# Patient Record
Sex: Female | Born: 1980
Health system: Southern US, Community
[De-identification: ages and names within clinical notes are randomized; demographics above are authoritative.]

## PROBLEM LIST (undated history)

## (undated) ENCOUNTER — Inpatient Hospital Stay (HOSPITAL_COMMUNITY): Payer: Self-pay

## (undated) DIAGNOSIS — R87629 Unspecified abnormal cytological findings in specimens from vagina: Secondary | ICD-10-CM

## (undated) DIAGNOSIS — A749 Chlamydial infection, unspecified: Secondary | ICD-10-CM

## (undated) DIAGNOSIS — A4902 Methicillin resistant Staphylococcus aureus infection, unspecified site: Secondary | ICD-10-CM

## (undated) DIAGNOSIS — O409XX0 Polyhydramnios, unspecified trimester, not applicable or unspecified: Secondary | ICD-10-CM

## (undated) DIAGNOSIS — T7840XA Allergy, unspecified, initial encounter: Secondary | ICD-10-CM

## (undated) DIAGNOSIS — F329 Major depressive disorder, single episode, unspecified: Secondary | ICD-10-CM

## (undated) DIAGNOSIS — A599 Trichomoniasis, unspecified: Secondary | ICD-10-CM

## (undated) DIAGNOSIS — B999 Unspecified infectious disease: Secondary | ICD-10-CM

## (undated) DIAGNOSIS — F32A Depression, unspecified: Secondary | ICD-10-CM

## (undated) DIAGNOSIS — O009 Unspecified ectopic pregnancy without intrauterine pregnancy: Secondary | ICD-10-CM

## (undated) DIAGNOSIS — A549 Gonococcal infection, unspecified: Secondary | ICD-10-CM

## (undated) HISTORY — PX: CERVICAL CONE BIOPSY: SUR198

## (undated) HISTORY — DX: Allergy, unspecified, initial encounter: T78.40XA

---
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
## 2003-03-18 ENCOUNTER — Emergency Department (HOSPITAL_COMMUNITY): Admission: AD | Admit: 2003-03-18 | Discharge: 2003-03-18 | Payer: Self-pay | Admitting: Family Medicine

## 2006-04-13 DIAGNOSIS — A4902 Methicillin resistant Staphylococcus aureus infection, unspecified site: Secondary | ICD-10-CM

## 2006-04-13 HISTORY — DX: Methicillin resistant Staphylococcus aureus infection, unspecified site: A49.02

## 2006-07-05 ENCOUNTER — Emergency Department (HOSPITAL_COMMUNITY): Admission: EM | Admit: 2006-07-05 | Discharge: 2006-07-05 | Payer: Self-pay | Admitting: Family Medicine

## 2006-10-05 ENCOUNTER — Emergency Department (HOSPITAL_COMMUNITY): Admission: EM | Admit: 2006-10-05 | Discharge: 2006-10-05 | Payer: Self-pay | Admitting: Emergency Medicine

## 2007-02-14 ENCOUNTER — Emergency Department (HOSPITAL_COMMUNITY): Admission: EM | Admit: 2007-02-14 | Discharge: 2007-02-14 | Payer: Self-pay | Admitting: Emergency Medicine

## 2007-03-21 ENCOUNTER — Ambulatory Visit: Payer: Self-pay | Admitting: Family Medicine

## 2008-11-08 ENCOUNTER — Emergency Department (HOSPITAL_COMMUNITY): Admission: EM | Admit: 2008-11-08 | Discharge: 2008-11-08 | Payer: Self-pay | Admitting: Family Medicine

## 2010-07-20 LAB — POCT RAPID STREP A (OFFICE): Streptococcus, Group A Screen (Direct): NEGATIVE

## 2010-07-20 LAB — POCT INFECTIOUS MONO SCREEN: Mono Screen: POSITIVE — AB

## 2011-04-08 ENCOUNTER — Emergency Department (HOSPITAL_COMMUNITY)
Admission: EM | Admit: 2011-04-08 | Discharge: 2011-04-08 | Disposition: A | Discharge status: Home / Self Care | Payer: Self-pay | Source: Home / Self Care | Attending: Family Medicine | Admitting: Family Medicine

## 2011-04-08 DIAGNOSIS — R3911 Hesitancy of micturition: Secondary | ICD-10-CM

## 2011-04-08 LAB — POCT URINALYSIS DIP (DEVICE)
Bilirubin Urine: NEGATIVE
Glucose, UA: NEGATIVE mg/dL
Ketones, ur: 15 mg/dL — AB
Leukocytes, UA: NEGATIVE
pH: 5.5 (ref 5.0–8.0)

## 2011-04-08 NOTE — ED Notes (Addendum)
Pt states that on 12-21, she had a random UDS on her job, and was unable to void in sufficient amt to provide adequate  sample after 3 hours in spite of forcing fluids , and was reportedly inst to come to the [facility] or her regular MD  Within 5 days to determine cause of her "shy bladder" problem to avoid issues with continued employment

## 2011-04-08 NOTE — ED Provider Notes (Signed)
History     CSN: [id number]  Arrival date & time 04/08/11  1751   First MD Initiated Contact with Patient 04/08/11 1803      Chief Complaint  Patient presents with  . Urinary Retention    (Consider location/radiation/quality/duration/timing/severity/associated sxs/prior treatment) HPI Comments: Linda Moyer presents for evaluation of "shy bladder." She reports having difficulty producing urine on a random urine drug screen on 04/03/11, for her employer. 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 She adamantly denies any illegal drug use, no alcohol. She states that she was referred for evaluation of "shy bladder." She denies any dysuria, frequency, urgency, pain.  Patient is a 30 y.o. female presenting with female genitourinary complaint. The history is provided by the patient.  Female GU Problem Primary symptoms include no discharge, no genital itching, no genital odor and no dysuria. There has been no fever. Pertinent negatives include no frequency. Sexual activity: non-contributory.    History reviewed. No pertinent past medical history.  History reviewed. No pertinent past surgical history.  History reviewed. No pertinent family history.  History  Substance Use Topics  . Smoking status: Never Smoker   . Smokeless tobacco: Not on file  . Alcohol Use: Yes     occasional     OB History    Grav Para Term Preterm Abortions TAB SAB Ect Mult Living                  Review of Systems  Constitutional: Negative.   HENT: Negative.   Eyes: Negative.   Respiratory: Negative.   Cardiovascular: Negative.   Gastrointestinal: Negative.   Genitourinary: Positive for decreased urine volume and difficulty urinating. Negative for dysuria, urgency, frequency and enuresis.  Musculoskeletal: Negative.   Skin: Negative.   Neurological: Negative.     Allergies  Review of patient's allergies indicates no known allergies.  Home Medications  No current outpatient prescriptions  on file.  BP 119/81  Pulse 70  Temp(Src) 98 F (36.7 C) (Oral)  Resp 14  SpO2 100%  Physical Exam  Nursing note and vitals reviewed. Constitutional: She is oriented to person, place, and time. She appears well-developed and well-nourished.  HENT:  Head: Normocephalic and atraumatic.  Eyes: EOM are normal.  Neck: Normal range of motion.  Pulmonary/Chest: Effort normal.  Musculoskeletal: Normal range of motion.  Neurological: She is alert and oriented to person, place, and time.  Skin: Skin is warm and dry.  Psychiatric: Her behavior is normal.    ED Course  Procedures (including critical care time)  Labs Reviewed  POCT URINALYSIS DIP (DEVICE) - Abnormal; Notable for the following:    Ketones, ur 15 (*)    All other components within normal limits  POCT URINALYSIS DIPSTICK   No results found.   1. Urinary hesitancy       MDM  Labs reviewed        Richardo Priest, MD 04/08/11 2038

## 2012-05-20 ENCOUNTER — Ambulatory Visit: Payer: Self-pay | Admitting: Family Medicine

## 2012-05-20 VITALS — BP 107/72 | HR 75 | Temp 98.6°F | Resp 16 | Ht 66.5 in | Wt 123.8 lb

## 2012-05-20 DIAGNOSIS — Z0289 Encounter for other administrative examinations: Secondary | ICD-10-CM

## 2012-05-20 NOTE — Progress Notes (Signed)
  Subjective:    Patient ID: Linda Moyer, female    DOB: June 06, 1980, 32 y.o.   MRN: [id number]  HPI Linda Moyer is a 32 y.o. female Here for DOT physical.   Last DOT about 2 years ago.  - 07/09/10. 2 year card. No restrictions.  No medical problems, no meds, no surgeries.    Review of Systems  Eyes: Negative for visual disturbance.  Musculoskeletal: Negative for arthralgias.  Neurological: Negative for weakness.       Objective:   Physical Exam  Vitals reviewed. Constitutional: She is oriented to person, place, and time. She appears well-developed and well-nourished.  HENT:  Head: Normocephalic and atraumatic.  Right Ear: External ear normal.  Left Ear: External ear normal.  Mouth/Throat: Oropharynx is clear and moist.  Eyes: Conjunctivae normal are normal. Pupils are equal, round, and reactive to light.  Neck: Normal range of motion. Neck supple. No thyromegaly present.  Cardiovascular: Normal rate, regular rhythm, normal heart sounds and intact distal pulses.   No murmur heard. Pulmonary/Chest: Effort normal and breath sounds normal. No respiratory distress. She has no wheezes.  Abdominal: Soft. Bowel sounds are normal. There is no tenderness. No hernia.  Musculoskeletal: Normal range of motion. She exhibits no edema and no tenderness.  Lymphadenopathy:    She has no cervical adenopathy.  Neurological: She is alert and oriented to person, place, and time.  Skin: Skin is warm and dry. No rash noted.  Psychiatric: She has a normal mood and affect. Her behavior is normal. Thought content normal.       Assessment & Plan:  CHEYENNA Moyer is a 32 y.o. female 1. Health examination of defined subpopulation    DPT physical.  No concerning findings on exam or hx. 2 year card.

## 2012-05-24 ENCOUNTER — Encounter: Payer: Self-pay | Admitting: Family Medicine

## 2012-08-09 ENCOUNTER — Ambulatory Visit: Payer: Self-pay | Admitting: Emergency Medicine

## 2012-08-09 VITALS — BP 119/83 | HR 71 | Temp 98.6°F | Resp 16 | Ht 66.5 in | Wt 126.0 lb

## 2012-08-09 DIAGNOSIS — L259 Unspecified contact dermatitis, unspecified cause: Secondary | ICD-10-CM

## 2012-08-09 MED ORDER — TRIAMCINOLONE ACETONIDE 0.1 % EX CREA: TOPICAL_CREAM | Freq: Two times a day (BID) | CUTANEOUS | Status: DC

## 2012-08-09 NOTE — Progress Notes (Signed)
[facility] [address], [place] [address] [phone]  Date:  08/09/2012   Name:  Linda Moyer   DOB:  06-17-1980   MRN:  [id number]  PCP:  No PCP Per Patient    Chief Complaint: Rash   History of Present Illness:  Linda Moyer is a 32 y.o. very pleasant female patient who presents with the following:  No history of allergen exposure, new personal care product or medication or soap.  Has a rash on the right and left flank.  Pruritic.  No fever or chills.  Present for past few days.  No improvement with over the counter medications or other home remedies. Denies other complaint or health concern today.   There are no active problems to display for this patient.   Past Medical History  Diagnosis Date  . Allergy     History reviewed. No pertinent past surgical history.  History  Substance Use Topics  . Smoking status: Never Smoker   . Smokeless tobacco: Not on file  . Alcohol Use: Yes     Comment: occasional     History reviewed. No pertinent family history.  No Known Allergies  Medication list has been reviewed and updated.  Current Outpatient Prescriptions on File Prior to Visit  Medication Sig Dispense Refill  . Norgestimate-Ethinyl Estradiol Triphasic (ORTHO TRI-CYCLEN LO) 0.18/0.215/0.25 MG-25 MCG tab Take 1 tablet by mouth daily. Patient takes regular orthotri-cyclen. Switching back to LO.       No current facility-administered medications on file prior to visit.    Review of Systems:  As per HPI, otherwise negative.    Physical Examination: Filed Vitals:   08/09/12 2009  BP: 119/83  Pulse: 71  Temp: 98.6 F (37 C)  Resp: 16   Filed Vitals:   08/09/12 2009  Height: 5' 6.5" (1.689 m)  Weight: 126 lb (57.153 kg)   Body mass index is 20.03 kg/(m^2). Ideal Body Weight: Weight in (lb) to have BMI = 25: 156.9   GEN: WDWN, NAD, Non-toxic, Alert & Oriented x 3 HEENT: Atraumatic, Normocephalic.  Ears and Nose: No  external deformity. EXTR: No clubbing/cyanosis/edema NEURO: Normal gait.  PSYCH: Normally interactive. Conversant. Not depressed or anxious appearing.  Calm demeanor.  SKIN:  Rash on both right and left flank.  Erythematous.  No vesicles.  No cellulitis   Assessment and Plan: Allergic dermatitis TAC Benadryl while not working   Signed,  Phillips Odor, MD

## 2012-08-09 NOTE — Patient Instructions (Addendum)
Rash  A rash is a change in the color or texture of your skin. There are many different types of rashes. You may have other problems that accompany your rash.  CAUSES     Infections.   Allergic reactions. This can include allergies to pets or foods.   Certain medicines.   Exposure to certain chemicals, soaps, or cosmetics.   Heat.   Exposure to poisonous plants.   Tumors, both cancerous and noncancerous.  SYMPTOMS     Redness.   Scaly skin.   Itchy skin.   Dry or cracked skin.   Bumps.   Blisters.   Pain.  DIAGNOSIS   Your caregiver may do a physical exam to determine what type of rash you have. A skin sample (biopsy) may be taken and examined under a microscope.  TREATMENT    Treatment depends on the type of rash you have. Your caregiver may prescribe certain medicines. For serious conditions, you may need to see a skin doctor (dermatologist).  HOME CARE INSTRUCTIONS     Avoid the substance that caused your rash.   Do not scratch your rash. This can cause infection.   You may take cool baths to help stop itching.   Only take over-the-counter or prescription medicines as directed by your caregiver.   Keep all follow-up appointments as directed by your caregiver.  SEEK IMMEDIATE MEDICAL CARE IF:   You have increasing pain, swelling, or redness.   You have a fever.   You have new or severe symptoms.   You have body aches, diarrhea, or vomiting.   Your rash is not better after 3 days.  MAKE SURE YOU:   Understand these instructions.   Will watch your condition.   Will get help right away if you are not doing well or get worse.  Document Released: 03/20/2002 Document Revised: 06/22/2011 Document Reviewed: 01/12/2011  ExitCare Patient Information 2013 [employer].

## 2012-12-06 ENCOUNTER — Emergency Department (INDEPENDENT_AMBULATORY_CARE_PROVIDER_SITE_OTHER)
Admission: EM | Admit: 2012-12-06 | Discharge: 2012-12-06 | Disposition: A | Discharge status: Home / Self Care | Payer: 59 | Source: Home / Self Care

## 2012-12-06 ENCOUNTER — Encounter (HOSPITAL_COMMUNITY): Payer: Self-pay | Admitting: *Deleted

## 2012-12-06 DIAGNOSIS — L03119 Cellulitis of unspecified part of limb: Secondary | ICD-10-CM

## 2012-12-06 DIAGNOSIS — IMO0002 Reserved for concepts with insufficient information to code with codable children: Secondary | ICD-10-CM

## 2012-12-06 MED ORDER — FLUCONAZOLE 150 MG PO TABS: 150.0000 mg | ORAL_TABLET | Freq: Once | ORAL | Status: DC

## 2012-12-06 MED ORDER — DOXYCYCLINE HYCLATE 100 MG PO CAPS: 100.0000 mg | ORAL_CAPSULE | Freq: Two times a day (BID) | ORAL | Status: DC

## 2012-12-06 NOTE — ED Notes (Signed)
Pt  Noticed  A  Red   Swollen      Itching  Area  To  r  Elbow      For  sev  Days      Pt    Relates  It to a  Possible  Insect  Bite      She  Displays  No       Angioedema  And  Is  sittting   Upright on the  Exam table      Speaking in  Complete  sentances

## 2012-12-06 NOTE — ED Provider Notes (Signed)
Linda Moyer is a 32 y.o. female who presents to Urgent Care today for right medial elbow pain. Patient noted pain and erythema in her right medial elbow starting yesterday. It has worsened. She denies any fevers or chills nausea vomiting or diarrhea. She does not use any medications yet. She denies any injury. She thinks possibly she had a bug bite.   PMH reviewed. Healthy otherwise History  Substance Use Topics  . Smoking status: Never Smoker   . Smokeless tobacco: Not on file  . Alcohol Use: Yes     Comment: occasional    ROS as above Medications reviewed. No current facility-administered medications for this encounter.   Current Outpatient Prescriptions  Medication Sig Dispense Refill  . doxycycline (VIBRAMYCIN) 100 MG capsule Take 1 capsule (100 mg total) by mouth 2 (two) times daily.  20 capsule  0  . Norgestimate-Ethinyl Estradiol Triphasic (ORTHO TRI-CYCLEN LO) 0.18/0.215/0.25 MG-25 MCG tab Take 1 tablet by mouth daily. Patient takes regular orthotri-cyclen. Switching back to LO.        Exam:  BP 113/72  Pulse 78  Temp(Src) 98.6 F (37 C) (Oral)  Resp 20  SpO2 100%  LMP 12/06/2012 Gen: Well NAD HEENT: EOMI,  MMM Lungs: CTABL Nl WOB Heart: RRR no MRG Abd: NABS, NT, ND Exts: Non edematous BL  LE, warm and well perfused.  Skin: Right medial elbow 5 cm area of erythema and mild tenderness. No induration or fluctuance.   No results found for this or any previous visit (from the past 24 hour(s)). No results found.  Assessment and Plan: 32 y.o. female with cellulitis of the right medial elbow. Plan to treat with doxycycline Followup is worsening Discussed warning signs or symptoms. Please see discharge instructions. Patient expresses understanding.      Rodolph Bong, MD 12/06/12 2023

## 2013-02-16 ENCOUNTER — Encounter (HOSPITAL_COMMUNITY): Payer: Self-pay | Admitting: Emergency Medicine

## 2013-02-16 ENCOUNTER — Emergency Department (HOSPITAL_COMMUNITY)
Admission: EM | Admit: 2013-02-16 | Discharge: 2013-02-16 | Disposition: A | Discharge status: Home / Self Care | Payer: 59 | Source: Home / Self Care | Attending: Emergency Medicine | Admitting: Emergency Medicine

## 2013-02-16 ENCOUNTER — Other Ambulatory Visit (HOSPITAL_COMMUNITY)
Admission: RE | Admit: 2013-02-16 | Discharge: 2013-02-16 | Disposition: A | Discharge status: Home / Self Care | Payer: 59 | Source: Ambulatory Visit | Attending: Emergency Medicine | Admitting: Emergency Medicine

## 2013-02-16 DIAGNOSIS — Z113 Encounter for screening for infections with a predominantly sexual mode of transmission: Secondary | ICD-10-CM | POA: Insufficient documentation

## 2013-02-16 DIAGNOSIS — N760 Acute vaginitis: Secondary | ICD-10-CM | POA: Insufficient documentation

## 2013-02-16 DIAGNOSIS — Z202 Contact with and (suspected) exposure to infections with a predominantly sexual mode of transmission: Secondary | ICD-10-CM

## 2013-02-16 MED ORDER — FLUCONAZOLE 150 MG PO TABS: 150.0000 mg | ORAL_TABLET | Freq: Once | ORAL | Status: DC

## 2013-02-16 NOTE — ED Provider Notes (Signed)
Medical screening examination/treatment/procedure(s) were performed by a resident physician and as supervising physician I was immediately available for consultation/collaboration.  Leslee Home, M.D.  Reuben Likes, MD 02/16/13 2117

## 2013-02-16 NOTE — ED Provider Notes (Addendum)
CSN: [id number]     Arrival date & time 02/16/13  1811 History   First MD Initiated Contact with Patient 02/16/13 2022     Chief Complaint  Patient presents with  . Vaginitis   (Consider location/radiation/quality/duration/timing/severity/associated sxs/prior Treatment) HPI  VAGINAL DISCHARGE  Onset: 9 days ago Description: white Odor: none  Itching: yes  Symptoms Dysuria: no  Bleeding: no  Pelvic pain: no  Back pain: no  Fever: no  Genital sores: no  Rash: no  Dyspareunia: no  GI Sxs: no  Prior treatment: yes, tried OTC yeast treatment   Red Flags: Missed period: no  Pregnancy: no  Recent antibiotics: no  Sexual activity: yes, unprotected sex with new partner 4 weeks ago  Possible STD exposure: yes  IUD: no  Diabetes: no     Past Medical History  Diagnosis Date  . Allergy    History reviewed. No pertinent past surgical history. History reviewed. No pertinent family history. History  Substance Use Topics  . Smoking status: Never Smoker   . Smokeless tobacco: Not on file  . Alcohol Use: Yes     Comment: occasional    OB History   Grav Para Term Preterm Abortions TAB SAB Ect Mult Living                 Review of Systems  Allergies  Review of patient's allergies indicates no known allergies.  Home Medications   Current Outpatient Rx  Name  Route  Sig  Dispense  Refill  . Norgestimate-Ethinyl Estradiol Triphasic (ORTHO TRI-CYCLEN LO) 0.18/0.215/0.25 MG-25 MCG tab   Oral   Take 1 tablet by mouth daily. Patient takes regular orthotri-cyclen. Switching back to LO.         Marland Kitchen doxycycline (VIBRAMYCIN) 100 MG capsule   Oral   Take 1 capsule (100 mg total) by mouth 2 (two) times daily.   20 capsule   0   . fluconazole (DIFLUCAN) 150 MG tablet   Oral   Take 1 tablet (150 mg total) by mouth once.   2 tablet   0    BP 122/72  Pulse 65  Temp(Src) 98.6 F (37 C) (Oral)  Resp 16  SpO2 100%  LMP 01/25/2013 Physical Exam  Constitutional: She  appears well-developed and well-nourished. No distress.  Abdominal: Soft. Bowel sounds are normal. She exhibits no distension. There is no tenderness.  Genitourinary: Uterus normal. There is no tenderness or lesion on the right labia. There is no tenderness or lesion on the left labia. Cervix exhibits discharge. Cervix exhibits no motion tenderness and no friability. Right adnexum displays no tenderness. Left adnexum displays no tenderness. No tenderness or bleeding around the vagina. Vaginal discharge found.  Thin white cervical discharge  Skin: She is not diaphoretic.    ED Course  Procedures (including critical care time) Labs Review Labs Reviewed  CERVICOVAGINAL ANCILLARY ONLY   Imaging Review No results found.    MDM   1. Vaginitis   2. Possible exposure to STD    Will treat presumptively for candidal vaginitis. Will test for diarrhea, chlamydia, Trichomonas and bacterial vaginosis. Patient climbed an HIV test.   Garnetta Buddy, MD 02/16/13 7253  Garnetta Buddy, MD 02/16/13 2203

## 2013-02-16 NOTE — ED Provider Notes (Signed)
Medical screening examination/treatment/procedure(s) were performed by a resident physician and as supervising physician I was immediately available for consultation/collaboration.  Leslee Home, M.D.  Reuben Likes, MD 02/16/13 [phone]

## 2013-02-16 NOTE — ED Notes (Signed)
C/o yeast infection for a week now OTC monistat 3 was used and a cream was used but no relief.

## 2013-02-17 ENCOUNTER — Telehealth: Payer: Self-pay | Admitting: Family Medicine

## 2013-02-17 DIAGNOSIS — A5909 Other urogenital trichomoniasis: Secondary | ICD-10-CM

## 2013-02-17 MED ORDER — METRONIDAZOLE 500 MG PO TABS: 500.0000 mg | ORAL_TABLET | Freq: Two times a day (BID) | ORAL | Status: DC

## 2013-02-17 NOTE — Telephone Encounter (Signed)
Patient called and made aware of trich and gardnerella diagnosis and sent prescription for flagyl to pharmacy. Patient's questions answered.

## 2013-02-21 ENCOUNTER — Telehealth (HOSPITAL_COMMUNITY): Payer: Self-pay | Admitting: *Deleted

## 2013-02-21 NOTE — ED Notes (Signed)
GC/Chlamydia neg., Affirm: Candida neg., Gardnerella and Trich pos. Pt. adequately treated with Flagyl for both.  I called pt. Pt. verified x 2 and given results.  Pt. states the doctor called her the results on Friday night.  Pt. instructed to notify her partner to be treated with Flagyl, no sex until you have finished your medication and your partner has been treated and to practice safe sex. Pt. voiced understanding. Vassie Moselle 02/21/2013

## 2013-04-03 NOTE — ED Notes (Signed)
Pt  Phoned  Requesting   A  Refill of  Her  Flagyl   She  Reports  Symptoms  Returned        Pt  Advised  That it had  Been  Well over  1  Month  Since  She  Was  Examined  And  That  Would  Need  To  Be  Seen again    And  We would  Be  Glad  To  See  Her       Pt thanked  Clinical research associate

## 2014-03-17 ENCOUNTER — Encounter (HOSPITAL_COMMUNITY): Payer: Self-pay | Admitting: *Deleted

## 2014-03-17 ENCOUNTER — Inpatient Hospital Stay (HOSPITAL_COMMUNITY)
Admission: AD | Admit: 2014-03-17 | Discharge: 2014-03-17 | Disposition: A | Discharge status: Home / Self Care | Payer: 59 | Source: Ambulatory Visit | Attending: Family Medicine | Admitting: Family Medicine

## 2014-03-17 DIAGNOSIS — N390 Urinary tract infection, site not specified: Secondary | ICD-10-CM | POA: Insufficient documentation

## 2014-03-17 DIAGNOSIS — N3001 Acute cystitis with hematuria: Secondary | ICD-10-CM

## 2014-03-17 DIAGNOSIS — Z202 Contact with and (suspected) exposure to infections with a predominantly sexual mode of transmission: Secondary | ICD-10-CM | POA: Insufficient documentation

## 2014-03-17 LAB — URINALYSIS, ROUTINE W REFLEX MICROSCOPIC
GLUCOSE, UA: NEGATIVE mg/dL
KETONES UR: NEGATIVE mg/dL
Nitrite: POSITIVE — AB
PROTEIN: 100 mg/dL — AB
Specific Gravity, Urine: >1.03 — ABNORMAL HIGH (ref 1.005–1.030)
UROBILINOGEN UA: 1 mg/dL (ref 0.0–1.0)
pH: 5.5 (ref 5.0–8.0)

## 2014-03-17 LAB — URINE MICROSCOPIC-ADD ON

## 2014-03-17 LAB — CBC
HEMATOCRIT: 37.9 % (ref 36.0–46.0)
Hemoglobin: 12.7 g/dL (ref 12.0–15.0)
MCH: 30.7 pg (ref 26.0–34.0)
MCHC: 33.5 g/dL (ref 30.0–36.0)
MCV: 91.5 fL (ref 78.0–100.0)
PLATELETS: 276 10*3/uL (ref 150–400)
RBC: 4.14 MIL/uL (ref 3.87–5.11)
RDW: 12.3 % (ref 11.5–15.5)
WBC: 12.6 10*3/uL — ABNORMAL HIGH (ref 4.0–10.5)

## 2014-03-17 LAB — HIV ANTIBODY (ROUTINE TESTING W REFLEX): HIV: NONREACTIVE

## 2014-03-17 LAB — RPR

## 2014-03-17 LAB — WET PREP, GENITAL
Clue Cells Wet Prep HPF POC: NONE SEEN
Trich, Wet Prep: NONE SEEN
Yeast Wet Prep HPF POC: NONE SEEN

## 2014-03-17 LAB — POCT PREGNANCY, URINE: PREG TEST UR: NEGATIVE

## 2014-03-17 MED ORDER — FLUCONAZOLE 150 MG PO TABS: 150.0000 mg | ORAL_TABLET | Freq: Once | ORAL | Status: DC

## 2014-03-17 MED ORDER — NITROFURANTOIN MONOHYD MACRO 100 MG PO CAPS
100.0000 mg | ORAL_CAPSULE | Freq: Two times a day (BID) | ORAL | Status: DC
Start: 2014-03-17 — End: 2014-05-19

## 2014-03-17 MED ORDER — PHENAZOPYRIDINE HCL 100 MG PO TABS
ORAL_TABLET | ORAL | Status: AC
  Filled 2014-03-17: qty 1

## 2014-03-17 MED ORDER — NITROFURANTOIN MONOHYD MACRO 100 MG PO CAPS
100.0000 mg | ORAL_CAPSULE | Freq: Once | ORAL | Status: AC
  Administered 2014-03-17: 100 mg via ORAL

## 2014-03-17 MED ORDER — PHENAZOPYRIDINE HCL 200 MG PO TABS: 200.0000 mg | ORAL_TABLET | Freq: Three times a day (TID) | ORAL | Status: DC

## 2014-03-17 MED ORDER — PHENAZOPYRIDINE HCL 100 MG PO TABS
200.0000 mg | ORAL_TABLET | Freq: Once | ORAL | Status: AC
  Administered 2014-03-17: 200 mg via ORAL

## 2014-03-17 NOTE — Discharge Instructions (Signed)

## 2014-03-17 NOTE — MAU Note (Signed)
Patient presents with complaint of hematuria and painful urination today.

## 2014-03-17 NOTE — MAU Provider Note (Signed)
History     CSN: [id number]  Arrival date and time: 03/17/14 1229   None     Chief Complaint  Patient presents with  . Urinary Tract Infection  . Exposure to STD   HPIpt is 33 yo  Not pregnant female who presents with pain and bleeding with urination- small frequent incomplete emptying voidings- onset this morning- pt using OCs for contraception; pt is in new relationship- concerned may be exposed to STD Pt last had IC last night- was drunk and does not remember is she had pain with IC RN note: Dayton ScrapeMurray, RN Registered Nurse Signed  MAU Note 03/17/2014 12:41 PM    Expand All Collapse All   Patient presents with complaint of hematuria and painful urination today.         Past Medical History  Diagnosis Date  . Allergy     Past Surgical History  Procedure Laterality Date  . No past surgeries      History reviewed. No pertinent family history.  History  Substance Use Topics  . Smoking status: Never Smoker   . Smokeless tobacco: Never Used  . Alcohol Use: Yes     Comment: occasional     Allergies: No Known Allergies  Prescriptions prior to admission  Medication Sig Dispense Refill Last Dose  . doxycycline (VIBRAMYCIN) 100 MG capsule Take 1 capsule (100 mg total) by mouth 2 (two) times daily. 20 capsule 0   . fluconazole (DIFLUCAN) 150 MG tablet Take 1 tablet (150 mg total) by mouth once. 2 tablet 0   . metroNIDAZOLE (FLAGYL) 500 MG tablet Take 1 tablet (500 mg total) by mouth 2 (two) times daily. 14 tablet 0   . Norgestimate-Ethinyl Estradiol Triphasic (ORTHO TRI-CYCLEN LO) 0.18/0.215/0.25 MG-25 MCG tab Take 1 tablet by mouth daily. Patient takes regular orthotri-cyclen. Switching back to LO.   02/16/2013 at Unknown time    Review of Systems  Constitutional: Negative for fever and chills.  Gastrointestinal: Negative for nausea, vomiting and abdominal pain.  Genitourinary: Positive for dysuria, urgency, frequency and hematuria. Negative for flank pain.   Physical  Exam   Blood pressure 126/89, pulse 88, temperature 98.7 F (37.1 C), temperature source Oral, resp. rate 18, height 5' 6.5" (1.689 m), weight 130 lb 6 oz (59.138 kg), last menstrual period 02/26/2014.  Physical Exam  Nursing note and vitals reviewed. Constitutional: She is oriented to person, place, and time. She appears well-developed and well-nourished. No distress.  HENT:  Head: Normocephalic.  Eyes: Pupils are equal, round, and reactive to light.  Neck: Normal range of motion. Neck supple.  Cardiovascular: Normal rate.   Respiratory: Effort normal.  No CVA tenderness  GI: Soft.  Genitourinary:  Vagina- small amount of creamy white discharge in vault; mucosa pink, moist cervic- clean, NT Uterus NSSC - NT Adnexa without palpable enlargement or tenderness  Musculoskeletal: Normal range of motion.  Neurological: She is alert and oriented to person, place, and time.  Skin: Skin is warm and dry.  Psychiatric: She has a normal mood and affect.    MAU Course  Procedures RPR CBC HIV Wet prep GC chlamydia-pending Prydium 200mg  Results for orders placed or performed during the hospital encounter of 03/17/14 (from the past 24 hour(s))  Urinalysis, Routine w reflex microscopic     Status: Abnormal   Collection Time: 03/17/14 12:36 PM  Result Value Ref Range   Color, Urine AMBER (A) YELLOW   APPearance TURBID (A) CLEAR   Specific Gravity, Urine >1.030 (H) 1.005 -  1.030   pH 5.5 5.0 - 8.0   Glucose, UA NEGATIVE NEGATIVE mg/dL   Hgb urine dipstick LARGE (A) NEGATIVE   Bilirubin Urine MODERATE (A) NEGATIVE   Ketones, ur NEGATIVE NEGATIVE mg/dL   Protein, ur 213100 (A) NEGATIVE mg/dL   Urobilinogen, UA 1.0 0.0 - 1.0 mg/dL   Nitrite POSITIVE (A) NEGATIVE   Leukocytes, UA MODERATE (A) NEGATIVE  Urine microscopic-add on     Status: Abnormal   Collection Time: 03/17/14 12:36 PM  Result Value Ref Range   Squamous Epithelial / LPF FEW (A) RARE   WBC, UA 21-50 <3 WBC/hpf   RBC / HPF  TOO NUMEROUS TO COUNT <3 RBC/hpf   Bacteria, UA FEW (A) RARE  CBC     Status: Abnormal   Collection Time: 03/17/14  1:05 PM  Result Value Ref Range   WBC 12.6 (H) 4.0 - 10.5 K/uL   RBC 4.14 3.87 - 5.11 MIL/uL   Hemoglobin 12.7 12.0 - 15.0 g/dL   HCT 08.637.9 57.836.0 - 46.946.0 %   MCV 91.5 78.0 - 100.0 fL   MCH 30.7 26.0 - 34.0 pg   MCHC 33.5 30.0 - 36.0 g/dL   RDW 62.912.3 52.811.5 - 41.315.5 %   Platelets 276 150 - 400 K/uL  Pregnancy, urine POC     Status: None   Collection Time: 03/17/14  1:09 PM  Result Value Ref Range   Preg Test, Ur NEGATIVE NEGATIVE  Wet prep, genital     Status: Abnormal   Collection Time: 03/17/14  1:11 PM  Result Value Ref Range   Yeast Wet Prep HPF POC NONE SEEN NONE SEEN   Trich, Wet Prep NONE SEEN NONE SEEN   Clue Cells Wet Prep HPF POC NONE SEEN NONE SEEN   WBC, Wet Prep HPF POC FEW (A) NONE SEEN     Assessment and Plan  UTI- Rx Macrobid 100mg  Bid for 7 days Pyridium 200mg    Wilhelmina Hark 03/17/2014, 12:59 PM

## 2014-03-18 LAB — URINE CULTURE: SPECIAL REQUESTS: NORMAL

## 2014-03-19 LAB — GC/CHLAMYDIA PROBE AMP
CT PROBE, AMP APTIMA: POSITIVE — AB
GC PROBE AMP APTIMA: NEGATIVE

## 2014-03-20 ENCOUNTER — Telehealth: Payer: Self-pay | Admitting: General Practice

## 2014-03-20 DIAGNOSIS — A749 Chlamydial infection, unspecified: Secondary | ICD-10-CM

## 2014-03-20 MED ORDER — AZITHROMYCIN 250 MG PO TABS: 1000.0000 mg | ORAL_TABLET | Freq: Once | ORAL | Status: DC

## 2014-03-20 NOTE — Telephone Encounter (Signed)
-----   Message from Pennie BanterMarni W Smith sent at 03/20/2014 10:00 AM EST ----- [facility] called back [facility] [address]

## 2014-03-20 NOTE — Telephone Encounter (Signed)
Called patient and informed her of medication sent to pharmacy. Patient verbalized understanding and had no other questions

## 2014-05-19 ENCOUNTER — Ambulatory Visit (INDEPENDENT_AMBULATORY_CARE_PROVIDER_SITE_OTHER): Payer: Self-pay | Admitting: Emergency Medicine

## 2014-05-19 VITALS — BP 100/58 | HR 70 | Temp 98.4°F | Resp 12 | Ht 66.5 in | Wt 127.0 lb

## 2014-05-19 DIAGNOSIS — Z021 Encounter for pre-employment examination: Secondary | ICD-10-CM

## 2014-05-19 NOTE — Progress Notes (Signed)
[facility] [address], [place] [id number] [phone]  Date:  05/19/2014   Name:  Linda CarawayCalise R Yorio   DOB:  07/01/1980   MRN:  [id number]  PCP:  No PCP Per Patient    Chief Complaint: Commercial Driver's License Exam   History of Present Illness:  Linda CarawayCalise R Husband is a 34 y.o. very pleasant female patient who presents with the following:  DOT   There are no active problems to display for this patient.   Past Medical History  Diagnosis Date  . Allergy     Past Surgical History  Procedure Laterality Date  . No past surgeries      History  Substance Use Topics  . Smoking status: Never Smoker   . Smokeless tobacco: Never Used  . Alcohol Use: Yes     Comment: occasional     No family history on file.  No Known Allergies  Medication list has been reviewed and updated.  Current Outpatient Prescriptions on File Prior to Visit  Medication Sig Dispense Refill  . DiphenhydrAMINE HCl (BENADRYL PO) Take 1 tablet by mouth daily as needed (alleregies).    . Norgestimate-Ethinyl Estradiol Triphasic (ORTHO TRI-CYCLEN LO) 0.18/0.215/0.25 MG-25 MCG tab Take 1 tablet by mouth daily. Patient takes regular orthotri-cyclen. Switching back to LO.     No current facility-administered medications on file prior to visit.    Review of Systems:  As per HPI, otherwise negative.    Physical Examination: Filed Vitals:   05/19/14 1302  BP: 100/58  Pulse: 70  Temp: 98.4 F (36.9 C)  Resp: 12   Filed Vitals:   05/19/14 1302  Height: 5' 6.5" (1.689 m)  Weight: 127 lb (57.607 kg)   Body mass index is 20.19 kg/(m^2). Ideal Body Weight: Weight in (lb) to have BMI = 25: 156.9  GEN: WDWN, NAD, Non-toxic, A & O x 3 HEENT: Atraumatic, Normocephalic. Neck supple. No masses, No LAD. Ears and Nose: No external deformity. CV: RRR, No M/G/R. No JVD. No thrill. No extra heart sounds. PULM: CTA B, no wheezes, crackles, rhonchi. No retractions. No resp. distress. No  accessory muscle use. ABD: S, NT, ND, +BS. No rebound. No HSM. EXTR: No c/c/e NEURO Normal gait.  PSYCH: Normally interactive. Conversant. Not depressed or anxious appearing.  Calm demeanor.    Assessment and Plan: DOT  Signed,  Phillips OdorJeffery Anderson, MD

## 2015-07-03 ENCOUNTER — Other Ambulatory Visit (HOSPITAL_COMMUNITY)
Admission: RE | Admit: 2015-07-03 | Discharge: 2015-07-03 | Disposition: A | Discharge status: Home / Self Care | Payer: BLUE CROSS/BLUE SHIELD | Source: Ambulatory Visit | Attending: Family Medicine | Admitting: Family Medicine

## 2015-07-03 ENCOUNTER — Other Ambulatory Visit: Payer: Self-pay | Admitting: Physician Assistant

## 2015-07-03 DIAGNOSIS — Z124 Encounter for screening for malignant neoplasm of cervix: Secondary | ICD-10-CM | POA: Insufficient documentation

## 2015-07-05 LAB — CYTOLOGY - PAP

## 2016-06-12 ENCOUNTER — Ambulatory Visit (HOSPITAL_COMMUNITY)
Admission: EM | Admit: 2016-06-12 | Discharge: 2016-06-12 | Disposition: A | Discharge status: Home / Self Care | Payer: BLUE CROSS/BLUE SHIELD | Attending: Family Medicine | Admitting: Family Medicine

## 2016-06-12 ENCOUNTER — Encounter (HOSPITAL_COMMUNITY): Payer: Self-pay | Admitting: *Deleted

## 2016-06-12 DIAGNOSIS — S99921A Unspecified injury of right foot, initial encounter: Secondary | ICD-10-CM | POA: Diagnosis not present

## 2016-06-12 DIAGNOSIS — M79674 Pain in right toe(s): Secondary | ICD-10-CM

## 2016-06-12 NOTE — Discharge Instructions (Signed)
I have declined to x-ray your foot due to you being [redacted] weeks pregnant and because of the minimal impact you would have on your treatment plan. We have buddy taped your toe here in clinic. I recommend Tylenol as needed for pain management, for further pain management I recommend following up with your OB/GYN, if your pain persists I recommend following up with either orthopedics, or podiatry.

## 2016-06-12 NOTE — ED Notes (Signed)
affected  Toes  Buddy  Taped     Med  Female   Ortho  Shoe  -

## 2016-06-12 NOTE — ED Provider Notes (Signed)
CSN: [id number]     Arrival date & time 06/12/16  1012 History   First MD Initiated Contact with Patient 06/12/16 1035     Chief Complaint  Patient presents with  . Toe Injury   (Consider location/radiation/quality/duration/timing/severity/associated sxs/prior Treatment) 36 year old female presents to clinic with a chief complaint of pain to second digit of the right foot. She reports 2 weeks ago having stumbled on a free weight at a gym causing an injury to her toe, and that it was worsened yesterday when she stubbed her toe into some stairs leading up to a bus. She has had some bruising, swelling, and pain to the affected digit, however, she has not had any difficulty with walking, or bearing weight. Patient reports she is currently 6-1/[redacted] weeks pregnant and is under prenatal care.   The history is provided by the patient.    Past Medical History:  Diagnosis Date  . Allergy    Past Surgical History:  Procedure Laterality Date  . NO PAST SURGERIES     History reviewed. No pertinent family history. Social History  Substance Use Topics  . Smoking status: Never Smoker  . Smokeless tobacco: Never Used  . Alcohol use No     Comment: occasional    OB History    Gravida Para Term Preterm AB Living   1             SAB TAB Ectopic Multiple Live Births                 Review of Systems  Reason unable to perform ROS: As covered in history of present illness.  All other systems reviewed and are negative.   Allergies  Patient has no known allergies.  Home Medications   Prior to Admission medications   Medication Sig Start Date End Date Taking? Authorizing Provider  DiphenhydrAMINE HCl (BENADRYL PO) Take 1 tablet by mouth daily as needed (alleregies).    Historical Provider, MD   Meds Ordered and Administered this Visit  Medications - No data to display  BP 122/66 (BP Location: Left Arm)   Pulse 72   Temp 98.6 F (37 C) (Oral)   Resp 18   SpO2 100%  No data  found.   Physical Exam  Constitutional: She is oriented to person, place, and time. She appears well-developed and well-nourished. No distress.  HENT:  Head: Normocephalic and atraumatic.  Musculoskeletal:       Feet:  Neurological: She is alert and oriented to person, place, and time.  Skin: Skin is warm and dry. Capillary refill takes less than 2 seconds. She is not diaphoretic.  Psychiatric: She has a normal mood and affect.  Nursing note and vitals reviewed.   Urgent Care Course     Procedures (including critical care time)  Labs Review Labs Reviewed - No data to display  Imaging Review No results found.     MDM   1. Injury of toe on right foot, initial encounter    I have declined to x-ray your foot due to you being [redacted] weeks pregnant and because of the minimal impact you would have on your treatment plan. We have buddy taped your toe here in clinic. I recommend Tylenol as needed for pain management, for further pain management I recommend following up with your OB/GYN, if your pain persists I recommend following up with either orthopedics, or podiatry.     Dorena BodoLawrence Astin Rape, NP 06/12/16 [phone]

## 2016-06-12 NOTE — ED Triage Notes (Signed)
inj  r  2nd  Toe     10     Days   Ago  Stubbed  It  On    25  Lb  Weight         This  Is  The  First   Visit     reinjured  It   Yesterday    Pain  Is  Worse  On  Movement       And  Weight  bearing

## 2016-06-15 ENCOUNTER — Inpatient Hospital Stay (HOSPITAL_COMMUNITY)
Admission: AD | Admit: 2016-06-15 | Discharge: 2016-06-15 | Disposition: A | Discharge status: Home / Self Care | Payer: BLUE CROSS/BLUE SHIELD | Source: Ambulatory Visit | Attending: Family Medicine | Admitting: Family Medicine

## 2016-06-15 ENCOUNTER — Inpatient Hospital Stay (EMERGENCY_DEPARTMENT_HOSPITAL)
Admission: AD | Admit: 2016-06-15 | Discharge: 2016-06-15 | Disposition: A | Discharge status: Home / Self Care | Payer: BLUE CROSS/BLUE SHIELD | Source: Ambulatory Visit | Attending: Family Medicine | Admitting: Family Medicine

## 2016-06-15 ENCOUNTER — Encounter (HOSPITAL_COMMUNITY): Payer: Self-pay | Admitting: *Deleted

## 2016-06-15 ENCOUNTER — Inpatient Hospital Stay (HOSPITAL_COMMUNITY): Payer: BLUE CROSS/BLUE SHIELD

## 2016-06-15 DIAGNOSIS — O209 Hemorrhage in early pregnancy, unspecified: Secondary | ICD-10-CM | POA: Insufficient documentation

## 2016-06-15 DIAGNOSIS — O26899 Other specified pregnancy related conditions, unspecified trimester: Secondary | ICD-10-CM

## 2016-06-15 DIAGNOSIS — O9989 Other specified diseases and conditions complicating pregnancy, childbirth and the puerperium: Secondary | ICD-10-CM

## 2016-06-15 DIAGNOSIS — O009 Unspecified ectopic pregnancy without intrauterine pregnancy: Secondary | ICD-10-CM

## 2016-06-15 DIAGNOSIS — Z3A01 Less than 8 weeks gestation of pregnancy: Secondary | ICD-10-CM

## 2016-06-15 DIAGNOSIS — R109 Unspecified abdominal pain: Secondary | ICD-10-CM | POA: Diagnosis not present

## 2016-06-15 DIAGNOSIS — O3680X0 Pregnancy with inconclusive fetal viability, not applicable or unspecified: Secondary | ICD-10-CM

## 2016-06-15 DIAGNOSIS — O00102 Left tubal pregnancy without intrauterine pregnancy: Secondary | ICD-10-CM | POA: Diagnosis not present

## 2016-06-15 HISTORY — DX: Unspecified ectopic pregnancy without intrauterine pregnancy: O00.90

## 2016-06-15 LAB — URINALYSIS, ROUTINE W REFLEX MICROSCOPIC
BILIRUBIN URINE: NEGATIVE
GLUCOSE, UA: NEGATIVE mg/dL
Hgb urine dipstick: NEGATIVE
Ketones, ur: NEGATIVE mg/dL
Leukocytes, UA: NEGATIVE
Nitrite: NEGATIVE
PH: 5 (ref 5.0–8.0)
Protein, ur: NEGATIVE mg/dL
SPECIFIC GRAVITY, URINE: 1.014 (ref 1.005–1.030)

## 2016-06-15 LAB — CBC
HEMATOCRIT: 39.4 % (ref 36.0–46.0)
Hemoglobin: 13 g/dL (ref 12.0–15.0)
MCH: 30.5 pg (ref 26.0–34.0)
MCHC: 33 g/dL (ref 30.0–36.0)
MCV: 92.5 fL (ref 78.0–100.0)
PLATELETS: 231 10*3/uL (ref 150–400)
RBC: 4.26 MIL/uL (ref 3.87–5.11)
RDW: 12.4 % (ref 11.5–15.5)
WBC: 7.7 10*3/uL (ref 4.0–10.5)

## 2016-06-15 LAB — POCT PREGNANCY, URINE: Preg Test, Ur: POSITIVE — AB

## 2016-06-15 LAB — BUN: BUN: 13 mg/dL (ref 6–20)

## 2016-06-15 LAB — CREATININE, SERUM
Creatinine, Ser: 1.03 mg/dL — ABNORMAL HIGH (ref 0.44–1.00)
GFR calc Af Amer: >60 mL/min (ref >60)

## 2016-06-15 LAB — WET PREP, GENITAL
CLUE CELLS WET PREP: NONE SEEN
SPERM: NONE SEEN
TRICH WET PREP: NONE SEEN
YEAST WET PREP: NONE SEEN

## 2016-06-15 LAB — AST: AST: 18 U/L (ref 15–41)

## 2016-06-15 LAB — HCG, QUANTITATIVE, PREGNANCY: hCG, Beta Chain, Quant, S: 48 m[IU]/mL — ABNORMAL HIGH (ref <5)

## 2016-06-15 LAB — ABO/RH: ABO/RH(D): O POS

## 2016-06-15 MED ORDER — PROMETHAZINE HCL 25 MG PO TABS: 25.0000 mg | ORAL_TABLET | Freq: Four times a day (QID) | ORAL | 0 refills | Status: DC | PRN

## 2016-06-15 MED ORDER — METHOTREXATE INJECTION FOR WOMEN'S HOSPITAL
50.0000 mg/m2 | Freq: Once | INTRAMUSCULAR | Status: AC
Start: 2016-06-15 — End: 2016-06-15
  Administered 2016-06-15: 85 mg via INTRAMUSCULAR
  Filled 2016-06-15: qty 1.7

## 2016-06-15 NOTE — MAU Provider Note (Signed)
History   [id number]   Chief Complaint  Patient presents with  . Follow-up    HPI Linda Moyer is a 36 y.o. female G2P0010 here for methotrexate administration. Patient evaluated earlier today & diagnosed with suspected ectopic pregnancy. Pt had to go to work for a few hours before coming back for injection. No change in pain or bleeding.   Patient's last menstrual period was 04/27/2016.  OB History  Gravida Para Term Preterm AB Living  2       1    SAB TAB Ectopic Multiple Live Births    1          # Outcome Date GA Lbr Len/2nd Weight Sex Delivery Anes PTL Lv  2 Current           1 TAB               Past Medical History:  Diagnosis Date  . Allergy     No family history on file.  Social History   Social History  . Marital status: Single    Spouse name: N/A  . Number of children: N/A  . Years of education: N/A   Social History Main Topics  . Smoking status: Never Smoker  . Smokeless tobacco: Never Used  . Alcohol use No     Comment: occasional   . Drug use: No  . Sexual activity: Yes   Other Topics Concern  . Not on file   Social History Narrative  . No narrative on file    No Known Allergies  No current facility-administered medications on file prior to encounter.    Current Outpatient Prescriptions on File Prior to Encounter  Medication Sig Dispense Refill  . Prenatal Vit-Fe Fumarate-FA (PRENATAL MULTIVITAMIN) TABS tablet Take 1 tablet by mouth daily at 12 noon.       Physical Exam   Vitals:   06/15/16 1751  BP: 106/60  Pulse: 70  Resp: 16  Temp: 99.6 F (37.6 C)  TempSrc: Oral  SpO2: 100%  Weight: 137 lb 9.6 oz (62.4 kg)  Height: 5' 4.5" (1.638 m)    Physical Exam  Nursing note and vitals reviewed. Constitutional: She is oriented to person, place, and time. She appears well-developed and well-nourished. No distress.  HENT:  Head: Normocephalic and atraumatic.  Eyes: Conjunctivae are normal. Right eye exhibits no discharge. Left  eye exhibits no discharge. No scleral icterus.  Neck: Normal range of motion.  Respiratory: Effort normal. No respiratory distress.  Neurological: She is alert and oriented to person, place, and time.  Skin: Skin is warm and dry. She is not diaphoretic.  Psychiatric: She has a normal mood and affect. Her behavior is normal. Judgment and thought content normal.    MAU Course  Procedures Results for orders placed or performed during the hospital encounter of 06/15/16 (from the past 24 hour(s))  Urinalysis, Routine w reflex microscopic     Status: None   Collection Time: 06/15/16 10:20 AM  Result Value Ref Range   Color, Urine YELLOW YELLOW   APPearance CLEAR CLEAR   Specific Gravity, Urine 1.014 1.005 - 1.030   pH 5.0 5.0 - 8.0   Glucose, UA NEGATIVE NEGATIVE mg/dL   Hgb urine dipstick NEGATIVE NEGATIVE   Bilirubin Urine NEGATIVE NEGATIVE   Ketones, ur NEGATIVE NEGATIVE mg/dL   Protein, ur NEGATIVE NEGATIVE mg/dL   Nitrite NEGATIVE NEGATIVE   Leukocytes, UA NEGATIVE NEGATIVE  Pregnancy, urine POC     Status: Abnormal  Collection Time: 06/15/16 10:32 AM  Result Value Ref Range   Preg Test, Ur POSITIVE (A) NEGATIVE  Wet prep, genital     Status: Abnormal   Collection Time: 06/15/16 11:04 AM  Result Value Ref Range   Yeast Wet Prep HPF POC NONE SEEN NONE SEEN   Trich, Wet Prep NONE SEEN NONE SEEN   Clue Cells Wet Prep HPF POC NONE SEEN NONE SEEN   WBC, Wet Prep HPF POC MANY (A) NONE SEEN   Sperm NONE SEEN   CBC     Status: None   Collection Time: 06/15/16 11:22 AM  Result Value Ref Range   WBC 7.7 4.0 - 10.5 K/uL   RBC 4.26 3.87 - 5.11 MIL/uL   Hemoglobin 13.0 12.0 - 15.0 g/dL   HCT 16.1 09.6 - 04.5 %   MCV 92.5 78.0 - 100.0 fL   MCH 30.5 26.0 - 34.0 pg   MCHC 33.0 30.0 - 36.0 g/dL   RDW 40.9 81.1 - 91.4 %   Platelets 231 150 - 400 K/uL  ABO/Rh     Status: None   Collection Time: 06/15/16 11:22 AM  Result Value Ref Range   ABO/RH(D) O POS   hCG, quantitative,  pregnancy     Status: Abnormal   Collection Time: 06/15/16 11:22 AM  Result Value Ref Range   hCG, Beta Chain, Quant, S 48 (H) <5 mIU/mL  AST     Status: None   Collection Time: 06/15/16 11:22 AM  Result Value Ref Range   AST 18 15 - 41 U/L  BUN     Status: None   Collection Time: 06/15/16 11:22 AM  Result Value Ref Range   BUN 13 6 - 20 mg/dL  Creatinine, serum     Status: Abnormal   Collection Time: 06/15/16 11:22 AM  Result Value Ref Range   Creatinine, Ser 1.03 (H) 0.44 - 1.00 mg/dL   GFR calc non Af Amer >60 >60 mL/min   GFR calc Af Amer >60 >60 mL/min   US Ob Comp Less 14 Wks  Result Date: 06/15/2016 CLINICAL DATA:  Bleeding, pain. EXAM: OBSTETRIC <14 WK [place] AND TRANSVAGINAL OB US TECHNIQUE: Both transabdominal and transvaginal ultrasound examinations were performed for complete evaluation of the gestation as well as the maternal uterus, adnexal regions, and pelvic cul-de-sac. Transvaginal technique was performed to assess early pregnancy. COMPARISON:  None. FINDINGS: Intrauterine gestational sac: None visualized Yolk sac:  Not visualized Embryo:  Not visualized Cardiac Activity: Heart Rate:   bpm MSD:   mm    w     d CRL:    mm    w    d                  [place] EDC: Subchorionic hemorrhage:  None visualized. Maternal uterus/adnexae: No subchorionic hemorrhage. There are 2 small fibroids, 10 mm in the left fundus and 6 mm in the mid fundus. Small rounded soft tissue focus is seen in the left adnexa separate from the left ovary measuring 10 x 10 x 9 mm. Cannot exclude early ectopic pregnancy. IMPRESSION: No intrauterine pregnancy visualized. Small rounded soft tissue focus in the left adnexa measures 10 x 10 x 9 mm, separate from the left ovary. Cannot exclude early ectopic pregnancy. Critical Value/emergent results were called by telephone at the time of interpretation on 06/15/2016 at 12:39 pm to Judeth Horn, NP , who verbally acknowledged these results. Electronically Signed   By: Charlett Nose  M.D.   On: 06/15/2016 12:39   US Ob Transvaginal  Result Date: 06/15/2016 CLINICAL DATA:  Bleeding, pain. EXAM: OBSTETRIC <14 WK [place] AND TRANSVAGINAL OB US TECHNIQUE: Both transabdominal and transvaginal ultrasound examinations were performed for complete evaluation of the gestation as well as the maternal uterus, adnexal regions, and pelvic cul-de-sac. Transvaginal technique was performed to assess early pregnancy. COMPARISON:  None. FINDINGS: Intrauterine gestational sac: None visualized Yolk sac:  Not visualized Embryo:  Not visualized Cardiac Activity: Heart Rate:   bpm MSD:   mm    w     d CRL:    mm    w    d                  [place] EDC: Subchorionic hemorrhage:  None visualized. Maternal uterus/adnexae: No subchorionic hemorrhage. There are 2 small fibroids, 10 mm in the left fundus and 6 mm in the mid fundus. Small rounded soft tissue focus is seen in the left adnexa separate from the left ovary measuring 10 x 10 x 9 mm. Cannot exclude early ectopic pregnancy. IMPRESSION: No intrauterine pregnancy visualized. Small rounded soft tissue focus in the left adnexa measures 10 x 10 x 9 mm, separate from the left ovary. Cannot exclude early ectopic pregnancy. Critical Value/emergent results were called by telephone at the time of interpretation on 06/15/2016 at 12:39 pm to Judeth Horn, NP , who verbally acknowledged these results. Electronically Signed   By: Charlett Nose M.D.   On: 06/15/2016 12:39    MDM VSS Discussed risks/benefits/side effects of methotrexate vs untreated ectopic pregnancy. Patient agreeable to MTX injection. Labs reviewed.  O positive  Assessment and Plan  36 y.o. G2P0010 at [redacted]w[redacted]d wks Pregnancy Follow-up BHCG A: 1. Left tubal pregnancy without intrauterine pregnancy    P: Discharge home  Discussed reasons to return to [facility] Pt to have day 4 lab Thursday in [facility] (can't go to [facility] d/t work hours) No intercourse MTX info sheet given Rx phenergan Take tylenol prn   Judeth Horn, NP 06/15/2016 7:10 PM

## 2016-06-15 NOTE — MAU Note (Signed)
Here for f/u to results.  To discuss MTX with provider

## 2016-06-15 NOTE — MAU Note (Signed)
Comes on her period every 24 days, last was in Jan.  ? Period was late, started bleeding 2/19- has continued to bleeding since.  +HPT 2/13. occ has abd cramping. Has been confirmed at the health dept

## 2016-06-15 NOTE — Discharge Instructions (Signed)
Ectopic Pregnancy An ectopic pregnancy happens when a fertilized egg grows outside the uterus. A pregnancy cannot live outside of the uterus. This problem often happens in the fallopian tube. It is often caused by damage to the fallopian tube. If this problem is found early, you may be treated with medicine. If your tube tears or bursts open (ruptures), you will bleed inside. This is an emergency. You will need surgery. Get help right away. What are the signs or symptoms? You may have normal pregnancy symptoms at first. These include:  Missing your period.  Feeling sick to your stomach (nauseous).  Being tired.  Having tender breasts. Then, you may start to have symptoms that are not normal. These include:  Pain with sex (intercourse).  Bleeding from the vagina. This includes light bleeding (spotting).  Belly (abdomen) or lower belly cramping or pain. This may be felt on one side.  A fast heartbeat (pulse).  Passing out (fainting) after going poop (bowel movement). If your tube tears, you may have symptoms such as:  Really bad pain in the belly or lower belly. This happens suddenly.  Dizziness.  Passing out.  Shoulder pain. Get help right away if: You have any of these symptoms. This is an emergency. This information is not intended to replace advice given to you by your health care provider. Make sure you discuss any questions you have with your health care provider. Document Released: 06/26/2008 Document Revised: 09/05/2015 Document Reviewed: 11/09/2012 Elsevier Interactive Patient Education  2017 [employer] Threatened Miscarriage A threatened miscarriage occurs when you have vaginal bleeding during your first 20 weeks of pregnancy but the pregnancy has not ended. If you have vaginal bleeding during this time, your health care provider will do tests to make sure you are still pregnant. If the tests show you are still pregnant and the developing baby (fetus) inside your  womb (uterus) is still growing, your condition is considered a threatened miscarriage. A threatened miscarriage does not mean your pregnancy will end, but it does increase the risk of losing your pregnancy (complete miscarriage). What are the causes? The cause of a threatened miscarriage is usually not known. If you go on to have a complete miscarriage, the most common cause is an abnormal number of chromosomes in the developing baby. Chromosomes are the structures inside cells that hold all your genetic material. Some causes of vaginal bleeding that do not result in miscarriage include:  Having sex.  Having an infection.  Normal hormone changes of pregnancy.  Bleeding that occurs when an egg implants in your uterus. What increases the risk? Risk factors for bleeding in early pregnancy include:  Obesity.  Smoking.  Drinking excessive amounts of alcohol or caffeine.  Recreational drug use. What are the signs or symptoms?  Light vaginal bleeding.  Mild abdominal pain or cramps. How is this diagnosed? If you have bleeding with or without abdominal pain before 20 weeks of pregnancy, your health care provider will do tests to check whether you are still pregnant. One important test involves using sound waves and a computer (ultrasound) to create images of the inside of your uterus. Other tests include an internal exam of your vagina and uterus (pelvic exam) and measurement of your babys heart rate. You may be diagnosed with a threatened miscarriage if:  Ultrasound testing shows you are still pregnant.  Your babys heart rate is strong.  A pelvic exam shows that the opening between your uterus and your vagina (cervix) is closed.  Your  heart rate and blood pressure are stable.  Blood tests confirm you are still pregnant. How is this treated? No treatments have been shown to prevent a threatened miscarriage from going on to a complete miscarriage. However, the right home care is  important. Follow these instructions at home:  Make sure you keep all your appointments for prenatal care. This is very important.  Get plenty of rest.  Do not have sex or use tampons if you have vaginal bleeding.  Do not douche.  Do not smoke or use recreational drugs.  Do not drink alcohol.  Avoid caffeine. Contact a health care provider if:  You have light vaginal bleeding or spotting while pregnant.  You have abdominal pain or cramping.  You have a fever. Get help right away if:  You have heavy vaginal bleeding.  You have blood clots coming from your vagina.  You have severe low back pain or abdominal cramps.  You have fever, chills, and severe abdominal pain. This information is not intended to replace advice given to you by your health care provider. Make sure you discuss any questions you have with your health care provider. Document Released: 03/30/2005 Document Revised: 09/05/2015 Document Reviewed: 01/24/2013 Elsevier Interactive Patient Education  2017 [employer].

## 2016-06-15 NOTE — Discharge Instructions (Signed)
Ectopic Pregnancy °An ectopic pregnancy is when the fertilized egg attaches (implants) outside the uterus. Most ectopic pregnancies occur in one of the tubes where eggs travel from the ovary to the uterus (fallopian tubes), but the implanting can occur in other locations. In rare cases, ectopic pregnancies occur on the ovary, intestine, pelvis, abdomen, or cervix. In an ectopic pregnancy, the fertilized egg does not have the ability to develop into a normal, healthy baby. °A ruptured ectopic pregnancy is one in which tearing or bursting of a fallopian tube causes internal bleeding. Often, there is intense lower abdominal pain, and vaginal bleeding sometimes occurs. Having an ectopic pregnancy can be life-threatening. If this dangerous condition is not treated, it can lead to blood loss, shock, or even death. °What are the causes? °The most common cause of this condition is damage to one of the fallopian tubes. A fallopian tube may be narrowed or blocked, and that keeps the fertilized egg from reaching the uterus. °What increases the risk? °This condition is more likely to develop in women of childbearing age who have different levels of risk. The levels of risk can be divided into three categories. °High risk  °· You have gone through infertility treatment. °· You have had an ectopic pregnancy before. °· You have had surgery on the fallopian tubes, or another surgical procedure, such as an abortion. °· You have had surgery to have the fallopian tubes tied (tubal ligation). °· You have problems or diseases of the fallopian tubes. °· You have been exposed to diethylstilbestrol (DES). This medicine was used until 1971, and it had effects on babies whose mothers took the medicine. °· You become pregnant while using an IUD (intrauterine device) for birth control. °Moderate risk  °· You have a history of infertility. °· You have had an STI (sexually transmitted infection). °· You have a history of pelvic inflammatory  disease (PID). °· You have scarring from endometriosis. °· You have multiple sexual partners. °· You smoke. °Low risk  °· You have had pelvic surgery. °· You use vaginal douches. °· You became sexually active before age 18. °What are the signs or symptoms? °Common symptoms of this condition include normal pregnancy symptoms, such as missing a period, nausea, tiredness, abdominal pain, breast tenderness, and bleeding. However, ectopic pregnancy will have additional symptoms, such as: °· Pain with intercourse. °· Irregular vaginal bleeding or spotting. °· Cramping or pain on one side or in the lower abdomen. °· Fast heartbeat, low blood pressure, and sweating. °· Passing out while having a bowel movement. °Symptoms of a ruptured ectopic pregnancy and internal bleeding may include: °· Sudden, severe pain in the abdomen and pelvis. °· Dizziness, weakness, light-headedness, or fainting. °· Pain in the shoulder or neck area. °How is this diagnosed? °This condition is diagnosed by: °· A pelvic exam to locate pain or a mass in the abdomen. °· A pregnancy test. This blood test checks for the presence as well as the specific level of pregnancy hormone in the bloodstream. °· Ultrasound. This is performed if a pregnancy test is positive. In this test, a probe is inserted into the vagina. The probe will detect a fetus, possibly in a location other than the uterus. °· Taking a sample of uterus tissue (dilation and curettage, or D&C). °· Surgery to perform a visual exam of the inside of the abdomen using a thin, lighted tube that has a tiny camera on the end (laparoscope). °· Culdocentesis. This procedure involves inserting a needle at the   top of the vagina, behind the uterus. If blood is present in this area, it may indicate that a fallopian tube is torn. How is this treated? This condition is treated with medicine or surgery. Medicine   An injection of a medicine (methotrexate) may be given to cause the pregnancy tissue to  be absorbed. This medicine may save your fallopian tube. It may be given if:  The diagnosis is made early, with no signs of active bleeding.  The fallopian tube has not ruptured.  You are considered to be a good candidate for the medicine. Usually, pregnancy hormone blood levels are checked after methotrexate treatment. This is to be sure that the medicine is effective. It may take 4-6 weeks for the pregnancy to be absorbed. Most pregnancies will be absorbed by 3 weeks. Surgery   A laparoscope may be used to remove the pregnancy tissue.  If severe internal bleeding occurs, a larger cut (incision) may be made in the lower abdomen (laparotomy) to remove the fetus and placenta. This is done to stop the bleeding.  Part or all of the fallopian tube may be removed (salpingectomy) along with the fetus and placenta. The fallopian tube may also be repaired during the surgery.  In very rare circumstances, removal of the uterus (hysterectomy) may be required.  After surgery, pregnancy hormone testing may be done to be sure that there is no pregnancy tissue left. Whether your treatment is medicine or surgery, you may receive a Rho (D) immune globulin shot to prevent problems with any future pregnancy. This shot may be given if:  You are Rh-negative and the baby's father is Rh-positive.  You are Rh-negative and you do not know the Rh type of the baby's father. Follow these instructions at home:  Rest and limit your activity after the procedure for as long as told by your health care provider.  Until your health care provider says that it is safe:  Do not lift anything that is heavier than 10 lb (4.5 kg), or the limit that your health care provider tells you.  Avoid physical exercise and any movement that requires effort (is strenuous).  To help prevent constipation:  Eat a healthy diet that includes fruits, vegetables, and whole grains.  Drink 6-8 glasses of water per day. Get help right  away if:  You develop worsening pain that is not relieved by medicine.  You have:  A fever or chills.  Vaginal bleeding.  Redness and swelling at the incision site.  Nausea and vomiting.  You feel dizzy or weak.  You feel light-headed or you faint. This information is not intended to replace advice given to you by your health care provider. Make sure you discuss any questions you have with your health care provider. Document Released: 05/07/2004 Document Revised: 11/27/2015 Document Reviewed: 10/30/2015 Elsevier Interactive Patient Education  2017 [employer]  Methotrexate Treatment for an Ectopic Pregnancy, Care After Refer to this sheet in the next few weeks. These instructions provide you with information on caring for yourself after your procedure. Your health care provider may also give you more specific instructions. Your treatment has been planned according to current medical practices, but problems sometimes occur. Call your health care provider if you have any problems or questions after your procedure. What can I expect after the procedure? You may have some abdominal cramping, vaginal bleeding, and fatigue in the first few days after taking methotrexate. Some other possible side effects of methotrexate include:  Nausea.  Vomiting.  Diarrhea.  Mouth sores.  Swelling or irritation of the lining of your lungs (pneumonitis).  Liver damage.  Hair loss. Follow these instructions at home: After you have received the methotrexate medicine, you need to be careful of your activities and watch your condition for several weeks. It may take 1 week before your hormone levels return to normal. Activity   Do not have sexual intercourse until your health care provider says it is safe to do so.  You may resume your usual diet.  Limit strenuous activity.  Do not drink alcohol. General instructions   Do not take aspirin, ibuprofen, or naproxen (nonsteroidal  anti-inflammatory drugs [NSAIDs]).  Do not take folic acid, prenatal vitamins, or other vitamins that contain folic acid.  Avoid traveling too far away from your health care provider.  Keep all follow-up visits as told by your health care provider. This is important. Contact a health care provider if:  You cannot control your nausea and vomiting.  You cannot control your diarrhea.  You have sores in your mouth and want treatment.  You need pain medicine for your abdominal pain.  You have a rash.  You are having a reaction to the medicine. Get help right away if:  You have increasing abdominal or pelvic pain.  You notice increased bleeding.  You feel light-headed, or you faint.  You have shortness of breath.  Your heart rate increases.  You have a cough.  You have chills.  You have a fever. This information is not intended to replace advice given to you by your health care provider. Make sure you discuss any questions you have with your health care provider. Document Released: 03/19/2011 Document Revised: 09/05/2015 Document Reviewed: 01/16/2013 Elsevier Interactive Patient Education  2017 [employer]

## 2016-06-15 NOTE — MAU Provider Note (Signed)
History     CSN: [id number]  Arrival date and time: 06/15/16 [id number]   First Provider Initiated Contact with Patient 06/15/16 1058      Chief Complaint  Patient presents with  . Abdominal Cramping  . Vaginal Bleeding   HPI  Linda Moyer is a 36 y.o. G2P0010 at 5548w0d by LMP who presents with abdominal cramping & vaginal bleeding. Had positive HPT 2/13 & again last week. Reports vaginal bleeding like a period that started 2/19. Bleeding has continued daily but has slowed down & more recently has been brown spotting. Also reports intermittent lower abdominal pain that is worse in the LLQ. Currently rates pain 1/10. Has not treated. Nothing makes better or worse.  Denies fever, n/v/d, constipation, dysuria, or vaginal discharge. Last BM was yesterday.   OB History    Gravida Para Term Preterm AB Living   2       1     SAB TAB Ectopic Multiple Live Births     1            Past Medical History:  Diagnosis Date  . Allergy     Past Surgical History:  Procedure Laterality Date  . NO PAST SURGERIES      History reviewed. No pertinent family history.  Social History  Substance Use Topics  . Smoking status: Never Smoker  . Smokeless tobacco: Never Used  . Alcohol use No     Comment: occasional     Allergies: No Known Allergies  Prescriptions Prior to Admission  Medication Sig Dispense Refill Last Dose  . Prenatal Vit-Fe Fumarate-FA (PRENATAL MULTIVITAMIN) TABS tablet Take 1 tablet by mouth daily at 12 noon.   06/15/2016 at 1000    Review of Systems  Constitutional: Negative for chills and fever.  Gastrointestinal: Positive for abdominal pain. Negative for abdominal distention, constipation, diarrhea, nausea and vomiting.  Genitourinary: Positive for vaginal bleeding. Negative for dysuria and vaginal discharge.  Neurological: Negative for syncope.   Physical Exam   Blood pressure 123/86, pulse 82, temperature 98.5 F (36.9 C), temperature source Oral, resp. rate 18, height  5' 5.5" (1.664 m), weight 138 lb (62.6 kg), last menstrual period 04/27/2016, SpO2 100 %.  Physical Exam  Nursing note and vitals reviewed. Constitutional: She is oriented to person, place, and time. She appears well-developed and well-nourished. No distress.  HENT:  Head: Normocephalic and atraumatic.  Eyes: Conjunctivae are normal. Right eye exhibits no discharge. Left eye exhibits no discharge. No scleral icterus.  Neck: Normal range of motion.  Cardiovascular: Normal rate, regular rhythm and normal heart sounds.   No murmur heard. Respiratory: Effort normal and breath sounds normal. No respiratory distress. She has no wheezes.  GI: Soft. Bowel sounds are normal. She exhibits no distension. There is no tenderness. There is no rebound and no guarding.  Genitourinary: Uterus normal. Cervix exhibits no motion tenderness and no friability. There is bleeding (small amount of brown blood) in the vagina.  Genitourinary Comments: Cervix closed  Neurological: She is alert and oriented to person, place, and time.  Skin: Skin is warm and dry. She is not diaphoretic.  Psychiatric: She has a normal mood and affect. Her behavior is normal. Judgment and thought content normal.    MAU Course  Procedures Results for orders placed or performed during the hospital encounter of 06/15/16 (from the past 24 hour(s))  Urinalysis, Routine w reflex microscopic     Status: None   Collection Time: 06/15/16 10:20 AM  Result  Value Ref Range   Color, Urine YELLOW YELLOW   APPearance CLEAR CLEAR   Specific Gravity, Urine 1.014 1.005 - 1.030   pH 5.0 5.0 - 8.0   Glucose, UA NEGATIVE NEGATIVE mg/dL   Hgb urine dipstick NEGATIVE NEGATIVE   Bilirubin Urine NEGATIVE NEGATIVE   Ketones, ur NEGATIVE NEGATIVE mg/dL   Protein, ur NEGATIVE NEGATIVE mg/dL   Nitrite NEGATIVE NEGATIVE   Leukocytes, UA NEGATIVE NEGATIVE  Pregnancy, urine POC     Status: Abnormal   Collection Time: 06/15/16 10:32 AM  Result Value Ref  Range   Preg Test, Ur POSITIVE (A) NEGATIVE  Wet prep, genital     Status: Abnormal   Collection Time: 06/15/16 11:04 AM  Result Value Ref Range   Yeast Wet Prep HPF POC NONE SEEN NONE SEEN   Trich, Wet Prep NONE SEEN NONE SEEN   Clue Cells Wet Prep HPF POC NONE SEEN NONE SEEN   WBC, Wet Prep HPF POC MANY (A) NONE SEEN   Sperm NONE SEEN   CBC     Status: None   Collection Time: 06/15/16 11:22 AM  Result Value Ref Range   WBC 7.7 4.0 - 10.5 K/uL   RBC 4.26 3.87 - 5.11 MIL/uL   Hemoglobin 13.0 12.0 - 15.0 g/dL   HCT 16.1 09.6 - 04.5 %   MCV 92.5 78.0 - 100.0 fL   MCH 30.5 26.0 - 34.0 pg   MCHC 33.0 30.0 - 36.0 g/dL   RDW 40.9 81.1 - 91.4 %   Platelets 231 150 - 400 K/uL  ABO/Rh     Status: None   Collection Time: 06/15/16 11:22 AM  Result Value Ref Range   ABO/RH(D) O POS   hCG, quantitative, pregnancy     Status: Abnormal   Collection Time: 06/15/16 11:22 AM  Result Value Ref Range   hCG, Beta Chain, Quant, S 48 (H) <5 mIU/mL  AST     Status: None   Collection Time: 06/15/16 11:22 AM  Result Value Ref Range   AST 18 15 - 41 U/L  BUN     Status: None   Collection Time: 06/15/16 11:22 AM  Result Value Ref Range   BUN 13 6 - 20 mg/dL  Creatinine, serum     Status: Abnormal   Collection Time: 06/15/16 11:22 AM  Result Value Ref Range   Creatinine, Ser 1.03 (H) 0.44 - 1.00 mg/dL   GFR calc non Af Amer >60 >60 mL/min   GFR calc Af Amer >60 >60 mL/min   US Ob Comp Less 14 Wks  Result Date: 06/15/2016 CLINICAL DATA:  Bleeding, pain. EXAM: OBSTETRIC <14 WK [place] AND TRANSVAGINAL OB US TECHNIQUE: Both transabdominal and transvaginal ultrasound examinations were performed for complete evaluation of the gestation as well as the maternal uterus, adnexal regions, and pelvic cul-de-sac. Transvaginal technique was performed to assess early pregnancy. COMPARISON:  None. FINDINGS: Intrauterine gestational sac: None visualized Yolk sac:  Not visualized Embryo:  Not visualized Cardiac Activity:  Heart Rate:   bpm MSD:   mm    w     d CRL:    mm    w    d                  [place] EDC: Subchorionic hemorrhage:  None visualized. Maternal uterus/adnexae: No subchorionic hemorrhage. There are 2 small fibroids, 10 mm in the left fundus and 6 mm in the mid fundus. Small rounded soft tissue focus is seen  in the left adnexa separate from the left ovary measuring 10 x 10 x 9 mm. Cannot exclude early ectopic pregnancy. IMPRESSION: No intrauterine pregnancy visualized. Small rounded soft tissue focus in the left adnexa measures 10 x 10 x 9 mm, separate from the left ovary. Cannot exclude early ectopic pregnancy. Critical Value/emergent results were called by telephone at the time of interpretation on 06/15/2016 at 12:39 pm to Judeth Horn, NP , who verbally acknowledged these results. Electronically Signed   By: Charlett Nose M.D.   On: 06/15/2016 12:39   US Ob Transvaginal  Result Date: 06/15/2016 CLINICAL DATA:  Bleeding, pain. EXAM: OBSTETRIC <14 WK [place] AND TRANSVAGINAL OB US TECHNIQUE: Both transabdominal and transvaginal ultrasound examinations were performed for complete evaluation of the gestation as well as the maternal uterus, adnexal regions, and pelvic cul-de-sac. Transvaginal technique was performed to assess early pregnancy. COMPARISON:  None. FINDINGS: Intrauterine gestational sac: None visualized Yolk sac:  Not visualized Embryo:  Not visualized Cardiac Activity: Heart Rate:   bpm MSD:   mm    w     d CRL:    mm    w    d                  [place] EDC: Subchorionic hemorrhage:  None visualized. Maternal uterus/adnexae: No subchorionic hemorrhage. There are 2 small fibroids, 10 mm in the left fundus and 6 mm in the mid fundus. Small rounded soft tissue focus is seen in the left adnexa separate from the left ovary measuring 10 x 10 x 9 mm. Cannot exclude early ectopic pregnancy. IMPRESSION: No intrauterine pregnancy visualized. Small rounded soft tissue focus in the left adnexa measures 10 x 10 x 9 mm, separate from  the left ovary. Cannot exclude early ectopic pregnancy. Critical Value/emergent results were called by telephone at the time of interpretation on 06/15/2016 at 12:39 pm to Judeth Horn, NP , who verbally acknowledged these results. Electronically Signed   By: Charlett Nose M.D.   On: 06/15/2016 12:39    MDM +UPT UA, wet prep, GC/chlamydia, CBC, ABO/Rh, quant hCG, HIV, and [place] today to rule out ectopic pregnancy O positive BHCG 48 Ultrasound shows no IUP & small 1 cm mass in left adnexa; "can't exclude ectopic pregnancy" S/w Dr. Shawnie Pons. Will offer patient MTX for suspected ectopic pregnancy.  Discussed results with patient. This is not a desired pregnancy. Pt agreeable to methotrexate but states she needs to go to work for a few hours first. Patient stable with normal VS & reliable. MTX labs pending. Pt will return after 4 pm for MTX injection.   Assessment and Plan  A: 1. Pregnancy of unknown anatomic location   2. Vaginal bleeding in pregnancy, first trimester   3. Abdominal pain affecting pregnancy    P: Discharge home Pt to return to MAU after work this evening Discussed reasons to return sooner  Judeth Horn 06/15/2016, 10:58 AM

## 2016-06-16 LAB — GC/CHLAMYDIA PROBE AMP (~~LOC~~) NOT AT ARMC
Chlamydia: NEGATIVE
NEISSERIA GONORRHEA: NEGATIVE

## 2016-06-16 LAB — HIV ANTIBODY (ROUTINE TESTING W REFLEX): HIV SCREEN 4TH GENERATION: NONREACTIVE

## 2016-06-18 ENCOUNTER — Inpatient Hospital Stay (HOSPITAL_COMMUNITY)
Admission: AD | Admit: 2016-06-18 | Discharge: 2016-06-18 | Disposition: A | Discharge status: Home / Self Care | Payer: BLUE CROSS/BLUE SHIELD | Source: Ambulatory Visit | Attending: Obstetrics and Gynecology | Admitting: Obstetrics and Gynecology

## 2016-06-18 DIAGNOSIS — Z79899 Other long term (current) drug therapy: Secondary | ICD-10-CM | POA: Insufficient documentation

## 2016-06-18 DIAGNOSIS — Z3A01 Less than 8 weeks gestation of pregnancy: Secondary | ICD-10-CM | POA: Diagnosis not present

## 2016-06-18 DIAGNOSIS — Z888 Allergy status to other drugs, medicaments and biological substances status: Secondary | ICD-10-CM | POA: Insufficient documentation

## 2016-06-18 DIAGNOSIS — O009 Unspecified ectopic pregnancy without intrauterine pregnancy: Secondary | ICD-10-CM | POA: Diagnosis present

## 2016-06-18 LAB — HCG, QUANTITATIVE, PREGNANCY: HCG, BETA CHAIN, QUANT, S: 18 m[IU]/mL — AB (ref <5)

## 2016-06-18 NOTE — MAU Provider Note (Signed)
History   [id number]   Chief Complaint  Patient presents with  . Labs Only  . Follow-up    HPI Linda Moyer is a 36 y.o. female G2P0010 here for follow-up BHCG. This is day 4 s/p methotrexate for ectopic pregnancy. Patient denies abdominal pain or vaginal bleeding. [facility] schedule.   Patient's last menstrual period was 04/27/2016.  OB History  Gravida Para Term Preterm AB Living  2       1    SAB TAB Ectopic Multiple Live Births    1          # Outcome Date GA Lbr Len/2nd Weight Sex Delivery Anes PTL Lv  2 Current           1 TAB               Past Medical History:  Diagnosis Date  . Allergy     No family history on file.  Social History   Social History  . Marital status: Single    Spouse name: N/A  . Number of children: N/A  . Years of education: N/A   Social History Main Topics  . Smoking status: Never Smoker  . Smokeless tobacco: Never Used  . Alcohol use No     Comment: occasional   . Drug use: No  . Sexual activity: Yes   Other Topics Concern  . Not on file   Social History Narrative  . No narrative on file    No Known Allergies  No current facility-administered medications on file prior to encounter.    Current Outpatient Prescriptions on File Prior to Encounter  Medication Sig Dispense Refill  . Prenatal Vit-Fe Fumarate-FA (PRENATAL MULTIVITAMIN) TABS tablet Take 1 tablet by mouth daily at 12 noon.    . promethazine (PHENERGAN) 25 MG tablet Take 1 tablet (25 mg total) by mouth every 6 (six) hours as needed for nausea or vomiting. 30 tablet 0     Physical Exam   Vitals:   06/18/16 1737  BP: (!) 95/52  Pulse: 65  Resp: 18  Temp: 98 F (36.7 C)  TempSrc: Oral    Physical Exam  Nursing note and vitals reviewed. Constitutional: She is oriented to person, place, and time. She appears well-developed and well-nourished. No distress.  HENT:  Head: Normocephalic and atraumatic.  Eyes:  Conjunctivae are normal. Right eye exhibits no discharge. Left eye exhibits no discharge. No scleral icterus.  Neck: Normal range of motion.  Respiratory: Effort normal. No respiratory distress.  Musculoskeletal: Normal range of motion.  Neurological: She is alert and oriented to person, place, and time.  Skin: Skin is warm and dry. She is not diaphoretic.  Psychiatric: She has a normal mood and affect. Her behavior is normal. Judgment and thought content normal.    MAU Course  Procedures Results for orders placed or performed during the hospital encounter of 06/18/16 (from the past 24 hour(s))  hCG, quantitative, pregnancy     Status: Abnormal   Collection Time: 06/18/16  6:31 PM  Result Value Ref Range   hCG, Beta Chain, Quant, S 18 (H) <5 mIU/mL    MDM Component     Latest Ref Rng & Units 06/15/2016 06/18/2016  HCG, Beta Chain, Quant, S     <5 mIU/mL 48 (H) 18 (H)   Drop in BHCG since day 1. Pt asymptomatic. Will bring back for day 7 lab on Sunday  Assessment  and Plan  36 y.o. G2P0010 at [redacted]w[redacted]d wks Pregnancy Follow-up BHCG 1. Ectopic pregnancy without intrauterine pregnancy, unspecified location    P: Discharge home Return to MAU Sunday for day 7 bhcg Discussed reasons to return to MAU  Judeth Horn, NP 06/18/2016 7:28 PM

## 2016-06-18 NOTE — Discharge Instructions (Signed)
Ectopic Pregnancy °An ectopic pregnancy is when the fertilized egg attaches (implants) outside the uterus. Most ectopic pregnancies occur in one of the tubes where eggs travel from the ovary to the uterus (fallopian tubes), but the implanting can occur in other locations. In rare cases, ectopic pregnancies occur on the ovary, intestine, pelvis, abdomen, or cervix. In an ectopic pregnancy, the fertilized egg does not have the ability to develop into a normal, healthy baby. °A ruptured ectopic pregnancy is one in which tearing or bursting of a fallopian tube causes internal bleeding. Often, there is intense lower abdominal pain, and vaginal bleeding sometimes occurs. Having an ectopic pregnancy can be life-threatening. If this dangerous condition is not treated, it can lead to blood loss, shock, or even death. °What are the causes? °The most common cause of this condition is damage to one of the fallopian tubes. A fallopian tube may be narrowed or blocked, and that keeps the fertilized egg from reaching the uterus. °What increases the risk? °This condition is more likely to develop in women of childbearing age who have different levels of risk. The levels of risk can be divided into three categories. °High risk  °· You have gone through infertility treatment. °· You have had an ectopic pregnancy before. °· You have had surgery on the fallopian tubes, or another surgical procedure, such as an abortion. °· You have had surgery to have the fallopian tubes tied (tubal ligation). °· You have problems or diseases of the fallopian tubes. °· You have been exposed to diethylstilbestrol (DES). This medicine was used until 1971, and it had effects on babies whose mothers took the medicine. °· You become pregnant while using an IUD (intrauterine device) for birth control. °Moderate risk  °· You have a history of infertility. °· You have had an STI (sexually transmitted infection). °· You have a history of pelvic inflammatory  disease (PID). °· You have scarring from endometriosis. °· You have multiple sexual partners. °· You smoke. °Low risk  °· You have had pelvic surgery. °· You use vaginal douches. °· You became sexually active before age 18. °What are the signs or symptoms? °Common symptoms of this condition include normal pregnancy symptoms, such as missing a period, nausea, tiredness, abdominal pain, breast tenderness, and bleeding. However, ectopic pregnancy will have additional symptoms, such as: °· Pain with intercourse. °· Irregular vaginal bleeding or spotting. °· Cramping or pain on one side or in the lower abdomen. °· Fast heartbeat, low blood pressure, and sweating. °· Passing out while having a bowel movement. °Symptoms of a ruptured ectopic pregnancy and internal bleeding may include: °· Sudden, severe pain in the abdomen and pelvis. °· Dizziness, weakness, light-headedness, or fainting. °· Pain in the shoulder or neck area. °How is this diagnosed? °This condition is diagnosed by: °· A pelvic exam to locate pain or a mass in the abdomen. °· A pregnancy test. This blood test checks for the presence as well as the specific level of pregnancy hormone in the bloodstream. °· Ultrasound. This is performed if a pregnancy test is positive. In this test, a probe is inserted into the vagina. The probe will detect a fetus, possibly in a location other than the uterus. °· Taking a sample of uterus tissue (dilation and curettage, or D&C). °· Surgery to perform a visual exam of the inside of the abdomen using a thin, lighted tube that has a tiny camera on the end (laparoscope). °· Culdocentesis. This procedure involves inserting a needle at the   top of the vagina, behind the uterus. If blood is present in this area, it may indicate that a fallopian tube is torn. °How is this treated? °This condition is treated with medicine or surgery. °Medicine  °· An injection of a medicine (methotrexate) may be given to cause the pregnancy tissue to  be absorbed. This medicine may save your fallopian tube. It may be given if: °¨ The diagnosis is made early, with no signs of active bleeding. °¨ The fallopian tube has not ruptured. °¨ You are considered to be a good candidate for the medicine. °Usually, pregnancy hormone blood levels are checked after methotrexate treatment. This is to be sure that the medicine is effective. It may take 4-6 weeks for the pregnancy to be absorbed. Most pregnancies will be absorbed by 3 weeks. °Surgery  °· A laparoscope may be used to remove the pregnancy tissue. °· If severe internal bleeding occurs, a larger cut (incision) may be made in the lower abdomen (laparotomy) to remove the fetus and placenta. This is done to stop the bleeding. °· Part or all of the fallopian tube may be removed (salpingectomy) along with the fetus and placenta. The fallopian tube may also be repaired during the surgery. °· In very rare circumstances, removal of the uterus (hysterectomy) may be required. °· After surgery, pregnancy hormone testing may be done to be sure that there is no pregnancy tissue left. °Whether your treatment is medicine or surgery, you may receive a Rho (D) immune globulin shot to prevent problems with any future pregnancy. This shot may be given if: °· You are Rh-negative and the baby's father is Rh-positive. °· You are Rh-negative and you do not know the Rh type of the baby's father. °Follow these instructions at home: °· Rest and limit your activity after the procedure for as long as told by your health care provider. °· Until your health care provider says that it is safe: °¨ Do not lift anything that is heavier than 10 lb (4.5 kg), or the limit that your health care provider tells you. °¨ Avoid physical exercise and any movement that requires effort (is strenuous). °· To help prevent constipation: °¨ Eat a healthy diet that includes fruits, vegetables, and whole grains. °¨ Drink 6-8 glasses of water per day. °Get help right  away if: °· You develop worsening pain that is not relieved by medicine. °· You have: °¨ A fever or chills. °¨ Vaginal bleeding. °¨ Redness and swelling at the incision site. °¨ Nausea and vomiting. °· You feel dizzy or weak. °· You feel light-headed or you faint. °This information is not intended to replace advice given to you by your health care provider. Make sure you discuss any questions you have with your health care provider. °Document Released: 05/07/2004 Document Revised: 11/27/2015 Document Reviewed: 10/30/2015 °Elsevier Interactive Patient Education © 2017 [employer] ° °

## 2016-06-18 NOTE — MAU Note (Signed)
Pt had MTX on March 5, here today for repeat BHCG.  Denies pain or bleeding.

## 2016-06-21 ENCOUNTER — Inpatient Hospital Stay (HOSPITAL_COMMUNITY)
Admission: AD | Admit: 2016-06-21 | Discharge: 2016-06-21 | Disposition: A | Discharge status: Home / Self Care | Payer: BLUE CROSS/BLUE SHIELD | Source: Ambulatory Visit | Attending: Obstetrics & Gynecology | Admitting: Obstetrics & Gynecology

## 2016-06-21 DIAGNOSIS — O009 Unspecified ectopic pregnancy without intrauterine pregnancy: Secondary | ICD-10-CM

## 2016-06-21 DIAGNOSIS — Z888 Allergy status to other drugs, medicaments and biological substances status: Secondary | ICD-10-CM | POA: Diagnosis not present

## 2016-06-21 DIAGNOSIS — Z3A01 Less than 8 weeks gestation of pregnancy: Secondary | ICD-10-CM | POA: Insufficient documentation

## 2016-06-21 LAB — HCG, QUANTITATIVE, PREGNANCY: HCG, BETA CHAIN, QUANT, S: 7 m[IU]/mL — AB (ref <5)

## 2016-06-21 NOTE — MAU Note (Signed)
Day 7 post MTX  

## 2016-06-21 NOTE — MAU Provider Note (Signed)
[name]  is a 36 y.o. G2P0010 at [id number] who presents to MAU today for follow-up quant hCG after 48 hours and and day 7 after MTX for ectopic pregnancy. The patient was seen in MAU on 06/18/16 and had quant hCG of 18. She endorses scant pink bleeding and "gas pain" over upper and lower abdomen.   OB History  Gravida Para Term Preterm AB Living  2       1    SAB TAB Ectopic Multiple Live Births    1          # Outcome Date GA Lbr Len/2nd Weight Sex Delivery Anes PTL Lv  2 Current           1 TAB               Past Medical History:  Diagnosis Date  . Allergy      BP 102/69 (BP Location: Right Arm)   Pulse (!) 51   Temp 97.7 F (36.5 C) (Oral)   Resp 16   LMP 04/27/2016   CONSTITUTIONAL: Well-developed, well-nourished female in no acute distress.  MUSCULOSKELETAL: Normal range of motion.  CARDIOVASCULAR: Regular heart rate RESPIRATORY: Normal effort NEUROLOGICAL: Alert and oriented to person, place, and time.  SKIN: Not diaphoretic. No erythema. No pallor. PSYCH: Normal mood and affect. Normal behavior. Normal judgment and thought content.  Results for orders placed or performed during the hospital encounter of 06/21/16 (from the past 24 hour(s))  hCG, quantitative, pregnancy     Status: Abnormal   Collection Time: 06/21/16  3:00 PM  Result Value Ref Range   hCG, Beta Chain, Quant, S 7 (H) <5 mIU/mL    A: Ectopic pregnancy S/p Methotrexate-appropriate drop in quant hCG    P: Discharge home First trimester/ectopic precautions discussed [facility]  Patient may return to MAU as needed or if her condition were to change or worsen   Donette LarryMelanie Delray Reza, CNM 06/21/2016 3:56 PM

## 2016-06-26 ENCOUNTER — Ambulatory Visit: Payer: BLUE CROSS/BLUE SHIELD

## 2016-06-26 DIAGNOSIS — O3680X0 Pregnancy with inconclusive fetal viability, not applicable or unspecified: Secondary | ICD-10-CM

## 2016-06-26 LAB — HCG, QUANTITATIVE, PREGNANCY: hCG, Beta Chain, Quant, S: 2 m[IU]/mL (ref <5)

## 2016-06-26 NOTE — Progress Notes (Signed)
Patient reported to office today for a repeat quant(stat). Patient reports no pain or bleeding at this time. Patient  Has been advised to wait for her results.  Patient has been given her results which are #2. She was informed that she will no longer need anymore quant check. Advised patient she should be getting period in the next two months if not she should follow up with her provider. Patient voice understanding.

## 2016-06-29 ENCOUNTER — Inpatient Hospital Stay (HOSPITAL_COMMUNITY)
Admission: AD | Admit: 2016-06-29 | Discharge: 2016-06-29 | Disposition: A | Discharge status: Home / Self Care | Payer: BLUE CROSS/BLUE SHIELD | Source: Ambulatory Visit | Attending: Obstetrics and Gynecology | Admitting: Obstetrics and Gynecology

## 2016-06-29 ENCOUNTER — Encounter (HOSPITAL_COMMUNITY): Payer: Self-pay | Admitting: *Deleted

## 2016-06-29 DIAGNOSIS — N939 Abnormal uterine and vaginal bleeding, unspecified: Secondary | ICD-10-CM | POA: Diagnosis present

## 2016-06-29 HISTORY — DX: Unspecified ectopic pregnancy without intrauterine pregnancy: O00.90

## 2016-06-29 HISTORY — DX: Unspecified abnormal cytological findings in specimens from vagina: R87.629

## 2016-06-29 HISTORY — DX: Methicillin resistant Staphylococcus aureus infection, unspecified site: A49.02

## 2016-06-29 HISTORY — DX: Unspecified infectious disease: B99.9

## 2016-06-29 NOTE — MAU Provider Note (Signed)
History     CSN: [id number]  Arrival date and time: 06/29/16 1725   First Provider Initiated Contact with Patient 06/29/16 1815      No chief complaint on file.  HPI   MS.Linda Moyer is a 36 y.o. female G2P0020 here in MAU with vaginal bleeding. She is status post treatment with MTX for an ectopic pregnancy. Her last beta hcg level was 2 on 3/16. She had sex this morning and started having menstrual like bleeding following intercourse. She is having no pain. She believes this is her menstrual cycle however wanted to be checked out to make sure everything was ok.    OB History    Gravida Para Term Preterm AB Living   2       2     SAB TAB Ectopic Multiple Live Births     1 1          Past Medical History:  Diagnosis Date  . Allergy   . Ectopic pregnancy 06/15/2016   Methotrexate  . Infection    UTI  . MRSA infection 2008  . Vaginal Pap smear, abnormal    cone bx, ok since    Past Surgical History:  Procedure Laterality Date  . NO PAST SURGERIES      Family History  Problem Relation Age of Onset  . Asthma Mother   . Hypertension Mother   . Hypertension Father   . Asthma Sister   . Asthma Brother     Social History  Substance Use Topics  . Smoking status: Never Smoker  . Smokeless tobacco: Never Used  . Alcohol use Yes     Comment: occasional     Allergies: No Known Allergies  Prescriptions Prior to Admission  Medication Sig Dispense Refill Last Dose  . promethazine (PHENERGAN) 25 MG tablet Take 1 tablet (25 mg total) by mouth every 6 (six) hours as needed for nausea or vomiting. (Patient not taking: Reported on 06/29/2016) 30 tablet 0 Not Taking at Unknown time   No results found for this or any previous visit (from the past 48 hour(s)).  Review of Systems  Constitutional: Negative for fever.  Gastrointestinal: Negative for abdominal pain.  Genitourinary: Positive for vaginal bleeding.   Physical Exam   Blood pressure 101/62, pulse 74,  temperature 98.5 F (36.9 C), temperature source Oral, resp. rate 16, SpO2 100 %, unknown if currently breastfeeding.  Physical Exam  Constitutional: She is oriented to person, place, and time. She appears well-developed and well-nourished. No distress.  HENT:  Head: Normocephalic.  Eyes: Pupils are equal, round, and reactive to light.  Respiratory: Effort normal.  GI: She exhibits no distension and no mass. There is no tenderness. There is no rebound and no guarding.  Genitourinary:  Genitourinary Comments: Vagina - Small amount of dark red blood noted in the vaginal canal. No clots.  Cervix - + small active bleeding.  Bimanual exam: Cervix closed Uterus non tender, normal size Adnexa non tender, no masses bilaterally Chaperone present for exam.   Musculoskeletal: Normal range of motion.  Neurological: She is alert and oriented to person, place, and time.  Skin: Skin is warm. She is not diaphoretic.  Psychiatric: Her behavior is normal.    MAU Course  Procedures  None  MDM    Assessment and Plan    A:  1. Vaginal bleeding problems     P:  Discharge home in stable condition Likely menstrual bleeding.  Bleeding precautions Return to MAU if  symptoms worsen Follow up with OB as scheduled or sooner if needed   Duane LopeJennifer I Rasch, NP 07/01/2016 9:04 AM

## 2016-06-29 NOTE — MAU Note (Signed)
Was cleared on 3/16, hcg was 2.  Waiting until this morning to have sex, then she started bleeding.   Had not had any bleeding when given Methotrexate.  States is bleeding like a period.  At one time her shoulder was tense, is fine now.  She thought they had said it would be 2 months before she had a period, so thought something was maybe wrong

## 2016-10-25 ENCOUNTER — Ambulatory Visit (HOSPITAL_COMMUNITY)
Admission: EM | Admit: 2016-10-25 | Discharge: 2016-10-25 | Disposition: A | Discharge status: Home / Self Care | Payer: BLUE CROSS/BLUE SHIELD | Attending: Internal Medicine | Admitting: Internal Medicine

## 2016-10-25 ENCOUNTER — Encounter (HOSPITAL_COMMUNITY): Payer: Self-pay | Admitting: Family Medicine

## 2016-10-25 DIAGNOSIS — L03116 Cellulitis of left lower limb: Secondary | ICD-10-CM | POA: Diagnosis not present

## 2016-10-25 MED ORDER — CETIRIZINE HCL 10 MG PO TABS: 10.0000 mg | ORAL_TABLET | Freq: Every day | ORAL | 0 refills | Status: DC

## 2016-10-25 MED ORDER — DOXYCYCLINE HYCLATE 100 MG PO CAPS: 100.0000 mg | ORAL_CAPSULE | Freq: Two times a day (BID) | ORAL | 0 refills | Status: DC

## 2016-10-25 MED ORDER — FLUCONAZOLE 150 MG PO TABS: 150.0000 mg | ORAL_TABLET | Freq: Every day | ORAL | 1 refills | Status: DC

## 2016-10-25 NOTE — Discharge Instructions (Signed)
There was not an obvious abscess to drain. Start doxycycline 100mg  twice a day for 10 days for skin infection. I have called in Diflucan for yeast infection. Take cetirizine 10mg  once a day for itchiness. Ice/heat compress. You can continue ankle compression to help with swelling. Elevate leg as needed. Monitor for worsening of symptoms, fever to follow up for re-evaluation.

## 2016-10-25 NOTE — ED Triage Notes (Signed)
Pt here for abscess to left ankle since Saturday,

## 2016-10-25 NOTE — ED Provider Notes (Signed)
CSN: [id number]     Arrival date & time 10/25/16  1843 History   None    Chief Complaint  Patient presents with  . Abscess   (Consider location/radiation/quality/duration/timing/severity/associated sxs/prior Treatment) 36 year old female comes in with 2 day history of bug bite on her left medial ankle. It is itchy in nature and scratched it. Noticed swelling and increased redness with increased warmth today. Has taken benadryl for the itchiness with some relief. Compression of the ankles have helped with the swelling. Still able to move ankle, though experiences some pain on movement, and some numbness and tingling of the foot due to the swelling. Denies fever, chills, nightsweats.       Past Medical History:  Diagnosis Date  . Allergy   . Ectopic pregnancy 06/15/2016   Methotrexate  . Infection    UTI  . MRSA infection 2008  . Vaginal Pap smear, abnormal    cone bx, ok since   Past Surgical History:  Procedure Laterality Date  . NO PAST SURGERIES     Family History  Problem Relation Age of Onset  . Asthma Mother   . Hypertension Mother   . Hypertension Father   . Asthma Sister   . Asthma Brother    Social History  Substance Use Topics  . Smoking status: Never Smoker  . Smokeless tobacco: Never Used  . Alcohol use Yes     Comment: occasional    OB History    Gravida Para Term Preterm AB Living   2       2     SAB TAB Ectopic Multiple Live Births     1 1         Review of Systems  Constitutional: Negative for chills, diaphoresis and fever.  Respiratory: Negative for chest tightness, shortness of breath and wheezing.   Cardiovascular: Negative for chest pain and palpitations.  Skin: Positive for color change, rash and wound.    Allergies  Patient has no known allergies.  Home Medications   Prior to Admission medications   Medication Sig Start Date End Date Taking? Authorizing Provider  cetirizine (ZYRTEC) 10 MG tablet Take 1 tablet (10 mg total) by mouth  daily. 10/25/16   Cathie Hoops, Adrean Heitz V, PA-C  doxycycline (VIBRAMYCIN) 100 MG capsule Take 1 capsule (100 mg total) by mouth 2 (two) times daily. 10/25/16   Cathie Hoops, Hartleigh Edmonston V, PA-C  fluconazole (DIFLUCAN) 150 MG tablet Take 1 tablet (150 mg total) by mouth daily. 10/25/16   Belinda Fisher, PA-C   Meds Ordered and Administered this Visit  Medications - No data to display  BP 120/74   Pulse 67   Temp 98.4 F (36.9 C) (Oral)   Resp 18   SpO2 98%  No data found.   Physical Exam  Constitutional: She is oriented to person, place, and time. She appears well-developed and well-nourished. No distress.  HENT:  Head: Normocephalic and atraumatic.  Eyes: Pupils are equal, round, and reactive to light. Conjunctivae are normal.  Musculoskeletal:  Full ROM of the ankle, strength normal, sensation intact. Pedal pulses 2+  Neurological: She is alert and oriented to person, place, and time.  Skin: Skin is warm and dry.  Small 0.2cm x 0.2cm wound. With surrounding erythema and increased warmth about 10cm x 10cm in size. No induration felt.   Psychiatric: She has a normal mood and affect. Her behavior is normal. Judgment normal.    Urgent Care Course     Procedures (including  critical care time)  Labs Review Labs Reviewed - No data to display  Imaging Review No results found.       MDM   1. Cellulitis of left lower extremity    History and exam consistent with cellulitis, without obvious abscess. Patient with history of MRSA. Start Doxycycline 100mg  BID x 10 days. Start Cetirizine 10mg  QD for pruritis. Side effects of medicines discussed with patient. If notice any allergies to medicine, to stop medicines and contact PCP. Ice/heat compress, elevation as needed. Patient can continue ankle compress with brace for swelling. Discussed with patient there is the possibility of abscess development despite antibiotic treatment, to monitor for worsening of symptoms, increased pain, fever, to follow up for re-evaluation.     Belinda FisherYu, Comfort Iversen V, PA-C 10/25/16 [phone]

## 2016-11-30 ENCOUNTER — Ambulatory Visit
Admission: RE | Admit: 2016-11-30 | Discharge: 2016-11-30 | Disposition: A | Discharge status: Home / Self Care | Payer: BLUE CROSS/BLUE SHIELD | Source: Ambulatory Visit | Attending: Physician Assistant | Admitting: Physician Assistant

## 2016-11-30 ENCOUNTER — Other Ambulatory Visit: Payer: Self-pay | Admitting: Physician Assistant

## 2016-11-30 DIAGNOSIS — T1490XA Injury, unspecified, initial encounter: Secondary | ICD-10-CM

## 2017-12-14 ENCOUNTER — Ambulatory Visit (INDEPENDENT_AMBULATORY_CARE_PROVIDER_SITE_OTHER): Payer: BLUE CROSS/BLUE SHIELD

## 2017-12-14 ENCOUNTER — Encounter (HOSPITAL_COMMUNITY): Payer: Self-pay | Admitting: Emergency Medicine

## 2017-12-14 ENCOUNTER — Ambulatory Visit (HOSPITAL_COMMUNITY)
Admission: EM | Admit: 2017-12-14 | Discharge: 2017-12-14 | Disposition: A | Discharge status: Home / Self Care | Payer: BLUE CROSS/BLUE SHIELD | Attending: Family Medicine | Admitting: Family Medicine

## 2017-12-14 DIAGNOSIS — R0789 Other chest pain: Secondary | ICD-10-CM | POA: Diagnosis not present

## 2017-12-14 DIAGNOSIS — J029 Acute pharyngitis, unspecified: Secondary | ICD-10-CM | POA: Diagnosis not present

## 2017-12-14 MED ORDER — NAPROXEN 500 MG PO TABS
500.0000 mg | ORAL_TABLET | Freq: Two times a day (BID) | ORAL | 0 refills | Status: DC
Start: 2017-12-14 — End: 2018-07-20

## 2017-12-14 MED ORDER — KETOROLAC TROMETHAMINE 60 MG/2ML IM SOLN
60.0000 mg | Freq: Once | INTRAMUSCULAR | Status: AC
  Administered 2017-12-14: 60 mg via INTRAMUSCULAR

## 2017-12-14 MED ORDER — KETOROLAC TROMETHAMINE 30 MG/ML IJ SOLN
INTRAMUSCULAR | Status: AC
  Filled 2017-12-14: qty 1

## 2017-12-14 NOTE — Discharge Instructions (Signed)
Chest x-ray was normal, EKG did not show any signs of chest discomfort  We gave you a shot of Toradol today in clinic, please take Naprosyn twice daily with food on your stomach for the next 1 to 2 weeks  Please follow-up with chest pain not improving, worsening, developing new symptoms, shortness of breath, cough, fever, leg pain or leg swelling  Sore Throat  Please continue Tylenol or Ibuprofen for fever and pain. May try salt water gargles, cepacol lozenges, throat spray, or OTC cold relief medicine for throat discomfort. If you also have congestion take a daily anti-histamine like Zyrtec, Claritin, and a oral decongestant to help with post nasal drip that may be irritating your throat.   Stay hydrated and drink plenty of fluids to keep your throat coated relieve irritation.

## 2017-12-14 NOTE — ED Triage Notes (Signed)
PT reports she woke up with back pain yesterday morning. During the day she developed some chest discomfort. Pain is worse with movements, deep breaths, coughs. No medical history.

## 2017-12-15 NOTE — ED Provider Notes (Signed)
[facility]    CSN: [id number] Arrival date & time: 12/14/17  1633     History   Chief Complaint Chief Complaint  Patient presents with  . Back Pain  . Chest Pain    HPI Linda Moyer is a 37 y.o. female negativity past medical history presenting today for evaluation of back pain and chest pain.  Patient states that yesterday she woke up with back pain, and throughout the day developed chest discomfort as well.  Pain worsens with specific movements including deep breaths, lying flat.  Has significant pain with lying flat.  She has tried Tylenol without relief.  Denies any specific injury, increase in activity.  Denies shortness of breath.  Denies leg pain or leg swelling.  Denies previous DVT/PE.  Denies history of smoking.  Denies using OCPs.  Denies recent travel or immobilization.  Patient does admit to an occasional cough, sore throat, but denies rhinorrhea.  HPI  Past Medical History:  Diagnosis Date  . Allergy   . Ectopic pregnancy 06/15/2016   Methotrexate  . Infection    UTI  . MRSA infection 2008  . Vaginal Pap smear, abnormal    cone bx, ok since    There are no active problems to display for this patient.   Past Surgical History:  Procedure Laterality Date  . NO PAST SURGERIES      OB History    Gravida  2   Para      Term      Preterm      AB  2   Living        SAB      TAB  1   Ectopic  1   Multiple      Live Births               Home Medications    Prior to Admission medications   Medication Sig Start Date End Date Taking? Authorizing Provider  cetirizine (ZYRTEC) 10 MG tablet Take 1 tablet (10 mg total) by mouth daily. 10/25/16   Cathie Hoops, Amy V, PA-C  doxycycline (VIBRAMYCIN) 100 MG capsule Take 1 capsule (100 mg total) by mouth 2 (two) times daily. 10/25/16   Cathie Hoops, Amy V, PA-C  fluconazole (DIFLUCAN) 150 MG tablet Take 1 tablet (150 mg total) by mouth daily. 10/25/16   Cathie Hoops, Amy V, PA-C  naproxen (NAPROSYN) 500 MG tablet  Take 1 tablet (500 mg total) by mouth 2 (two) times daily. 12/14/17   Gwynevere Lizana, Junius Creamer, PA-C    Family History Family History  Problem Relation Age of Onset  . Asthma Mother   . Hypertension Mother   . Hypertension Father   . Asthma Sister   . Asthma Brother     Social History Social History   Tobacco Use  . Smoking status: Never Smoker  . Smokeless tobacco: Never Used  Substance Use Topics  . Alcohol use: Yes    Comment: occasional   . Drug use: No     Allergies   Patient has no known allergies.   Review of Systems Review of Systems  Constitutional: Negative for fatigue and fever.  HENT: Positive for sore throat. Negative for congestion and sinus pressure.   Eyes: Negative for photophobia, pain and visual disturbance.  Respiratory: Positive for cough. Negative for shortness of breath.   Cardiovascular: Positive for chest pain.  Gastrointestinal: Negative for abdominal pain, nausea and vomiting.  Genitourinary: Negative for decreased urine volume and hematuria.  Musculoskeletal:  Positive for back pain and myalgias. Negative for neck pain and neck stiffness.  Neurological: Negative for dizziness, syncope, facial asymmetry, speech difficulty, weakness, light-headedness, numbness and headaches.     Physical Exam Triage Vital Signs ED Triage Vitals  Enc Vitals Group     BP 12/14/17 1734 112/70     Pulse Rate 12/14/17 1734 64     Resp 12/14/17 1734 16     Temp 12/14/17 1734 98.5 F (36.9 C)     Temp Source 12/14/17 1734 Oral     SpO2 12/14/17 1734 100 %     Weight 12/14/17 1735 130 lb (59 kg)     Height --      Head Circumference --      Peak Flow --      Pain Score 12/14/17 1734 8     Pain Loc --      Pain Edu? --      Excl. in GC? --    No data found.  Updated Vital Signs BP 112/70   Pulse 64   Temp 98.5 F (36.9 C) (Oral)   Resp 16   Wt 130 lb (59 kg)   LMP 11/27/2017   SpO2 100%   BMI 21.97 kg/m   Visual Acuity Right Eye Distance:   Left  Eye Distance:   Bilateral Distance:    Right Eye Near:   Left Eye Near:    Bilateral Near:     Physical Exam  Constitutional: She appears well-developed and well-nourished. No distress.  HENT:  Head: Normocephalic and atraumatic.  Mouth/Throat: Oropharynx is clear and moist.  Bilateral ears without tenderness to palpation of external auricle, tragus and mastoid, EAC's without erythema or swelling, TM's with good bony landmarks and cone of light. Non erythematous.  Oral mucosa pink and moist, no tonsillar enlargement or exudate. Posterior pharynx patent and nonerythematous, no uvula deviation or swelling. Normal phonation.  Eyes: Pupils are equal, round, and reactive to light. Conjunctivae and EOM are normal.  Neck: Neck supple.  Cardiovascular: Normal rate and regular rhythm.  No murmur heard. Pulmonary/Chest: Effort normal and breath sounds normal. No respiratory distress.  Breathing comfortably at rest, CTABL, no wheezing, rales or other adventitious sounds auscultated  Chest discomfort reproducible with palpation of anterior chest  Abdominal: Soft. There is no tenderness.  Becomes tearful when moving to supine position due to pain, abdominal exam performed with sitting upright  Musculoskeletal: She exhibits no edema.  Nontender to palpation of cervical, thoracic and lumbar spine midline, nontender to palpation of lateral musculature bilaterally throughout back, mild tenderness to palpation of trapezius musculature  Neurological: She is alert.  Skin: Skin is warm and dry.  Psychiatric: She has a normal mood and affect.  Nursing note and vitals reviewed.    UC Treatments / Results  Labs (all labs ordered are listed, but only abnormal results are displayed) Labs Reviewed - No data to display  EKG None  Radiology Dg Chest 2 View  Result Date: 12/14/2017 CLINICAL DATA:  Chest discomfort and back pain. EXAM: CHEST - 2 VIEW COMPARISON:  None. FINDINGS: The heart size and  mediastinal contours are within normal limits. Both lungs are clear. The visualized skeletal structures are unremarkable. IMPRESSION: No active cardiopulmonary disease. Electronically Signed   By: Obie Dredge M.D.   On: 12/14/2017 18:40    Procedures Procedures (including critical care time)  Medications Ordered in UC Medications  ketorolac (TORADOL) injection 60 mg (60 mg Intramuscular Given 12/14/17 1923)  Initial Impression / Assessment and Plan / UC Course  I have reviewed the triage vital signs and the nursing notes.  Pertinent labs & imaging results that were available during my care of the patient were reviewed by me and considered in my medical decision making (see chart for details).     X-ray negative, EKG sinus rhythm, slight sinus arrhythmia, does have nonspecific T wave abnormalities no acute signs of ischemia or infarction.  No previous EKG to compare to.  EKG also reviewed by Dr. Delton See.  Chest discomfort seems most likely musculoskeletal versus costochondritis given reproduced with palpation as well as worsening of symptoms with using musculature.  Will provide injection of Toradol in clinic today, will send home to continue anti-inflammatories consistently.  Advised patient to continue to monitor symptoms and follow-up if symptoms not improving or worsening.  Patient also had sore throat, recommended symptomatic management. Final Clinical Impressions(s) / UC Diagnoses   Final diagnoses:  Chest wall pain     Discharge Instructions     Chest x-ray was normal, EKG did not show any signs of chest discomfort  We gave you a shot of Toradol today in clinic, please take Naprosyn twice daily with food on your stomach for the next 1 to 2 weeks  Please follow-up with chest pain not improving, worsening, developing new symptoms, shortness of breath, cough, fever, leg pain or leg swelling  Sore Throat  Please continue Tylenol or Ibuprofen for fever and pain. May try salt  water gargles, cepacol lozenges, throat spray, or OTC cold relief medicine for throat discomfort. If you also have congestion take a daily anti-histamine like Zyrtec, Claritin, and a oral decongestant to help with post nasal drip that may be irritating your throat.   Stay hydrated and drink plenty of fluids to keep your throat coated relieve irritation.    ED Prescriptions    Medication Sig Dispense Auth. Provider   naproxen (NAPROSYN) 500 MG tablet Take 1 tablet (500 mg total) by mouth 2 (two) times daily. 30 tablet Jeffifer Rabold, [place] C, PA-C     Controlled Substance Prescriptions  Controlled Substance Registry consulted? Not Applicable   Lew Dawes, [place] 12/15/17 [phone]

## 2018-03-15 ENCOUNTER — Other Ambulatory Visit: Payer: Self-pay | Admitting: Physician Assistant

## 2018-03-15 ENCOUNTER — Ambulatory Visit
Admission: RE | Admit: 2018-03-15 | Discharge: 2018-03-15 | Disposition: A | Discharge status: Home / Self Care | Payer: BLUE CROSS/BLUE SHIELD | Source: Ambulatory Visit | Attending: Physician Assistant | Admitting: Physician Assistant

## 2018-03-15 DIAGNOSIS — R52 Pain, unspecified: Secondary | ICD-10-CM

## 2018-04-13 NOTE — L&D Delivery Note (Addendum)
OB/GYN Practice Delivery Note  [name] is a 38 y.o. G3P0020 s/p SVD at [redacted]w[redacted]d. She was admitted for IOL for IUGR  With EDF and intermittent REDF  ROM: 2h 36m with clear fluid GBS Status: GBS negative, received ppx for preterm status   Labor Progress: Patient presented to L&D for IOL for IUGR  with EDF and intermittent REDF . Initial SVE: fingertip which then progressed to complete with cytotec, arom, and pitocin augmentation.   Delivery Date/Time: 02/02/2019 at 1558  Delivery: Called to room and patient was complete and pushing. Head delivered ROA. No nuchal cord present. Shoulder and body delivered in usual fashion. Infant with spontaneous cry, placed on mother's abdomen, dried and stimulated. Cord clamped x 2 after 1-minute delay, and cut by father. Cord blood drawn. Placenta delivered spontaneously with gentle cord traction. Fundus firm with massage and Pitocin. Labia, perineum, vagina, and cervix inspected inspected with no lacerations noted.  Baby Weight: pending  Placenta: Sent to L&D Cord: 3 vessel  Complications: None Lacerations: none EBL: 100 Analgesia: none   Infant:  APGAR (1 MIN):  9 APGAR (5 MINS):  9  Maxie Better, MD, PGY1  [facility] 02/02/2019, 5:44 PM  I was gloved and present for entire delivery SVD without incident No difficulty with shoulders No lacerations  Postpartum appointment message sent to clinic  Mallie Snooks, CNM 02/02/19 5:47 PM

## 2018-07-20 ENCOUNTER — Other Ambulatory Visit: Payer: Self-pay

## 2018-07-20 ENCOUNTER — Inpatient Hospital Stay (HOSPITAL_COMMUNITY)
Admission: AD | Admit: 2018-07-20 | Discharge: 2018-07-20 | Disposition: A | Discharge status: Home / Self Care | Payer: BLUE CROSS/BLUE SHIELD | Attending: Obstetrics & Gynecology | Admitting: Obstetrics & Gynecology

## 2018-07-20 ENCOUNTER — Encounter (HOSPITAL_COMMUNITY): Payer: Self-pay | Admitting: *Deleted

## 2018-07-20 ENCOUNTER — Inpatient Hospital Stay (HOSPITAL_COMMUNITY): Payer: BLUE CROSS/BLUE SHIELD

## 2018-07-20 DIAGNOSIS — M545 Low back pain: Secondary | ICD-10-CM | POA: Diagnosis not present

## 2018-07-20 DIAGNOSIS — O9989 Other specified diseases and conditions complicating pregnancy, childbirth and the puerperium: Secondary | ICD-10-CM | POA: Diagnosis not present

## 2018-07-20 DIAGNOSIS — R109 Unspecified abdominal pain: Secondary | ICD-10-CM

## 2018-07-20 DIAGNOSIS — Z3A01 Less than 8 weeks gestation of pregnancy: Secondary | ICD-10-CM

## 2018-07-20 DIAGNOSIS — Z87891 Personal history of nicotine dependence: Secondary | ICD-10-CM | POA: Insufficient documentation

## 2018-07-20 DIAGNOSIS — O26891 Other specified pregnancy related conditions, first trimester: Secondary | ICD-10-CM | POA: Diagnosis not present

## 2018-07-20 DIAGNOSIS — R102 Pelvic and perineal pain: Secondary | ICD-10-CM | POA: Diagnosis not present

## 2018-07-20 DIAGNOSIS — O26899 Other specified pregnancy related conditions, unspecified trimester: Secondary | ICD-10-CM

## 2018-07-20 DIAGNOSIS — Z8759 Personal history of other complications of pregnancy, childbirth and the puerperium: Secondary | ICD-10-CM | POA: Insufficient documentation

## 2018-07-20 DIAGNOSIS — Z79899 Other long term (current) drug therapy: Secondary | ICD-10-CM | POA: Diagnosis not present

## 2018-07-20 HISTORY — DX: Chlamydial infection, unspecified: A74.9

## 2018-07-20 HISTORY — DX: Gonococcal infection, unspecified: A54.9

## 2018-07-20 HISTORY — DX: Trichomoniasis, unspecified: A59.9

## 2018-07-20 LAB — WET PREP, GENITAL
Sperm: NONE SEEN
Trich, Wet Prep: NONE SEEN
Yeast Wet Prep HPF POC: NONE SEEN

## 2018-07-20 LAB — CBC
HCT: 35.8 % — ABNORMAL LOW (ref 36.0–46.0)
Hemoglobin: 11.9 g/dL — ABNORMAL LOW (ref 12.0–15.0)
MCH: 30.3 pg (ref 26.0–34.0)
MCHC: 33.2 g/dL (ref 30.0–36.0)
MCV: 91.1 fL (ref 80.0–100.0)
Platelets: 268 10*3/uL (ref 150–400)
RBC: 3.93 MIL/uL (ref 3.87–5.11)
RDW: 11.9 % (ref 11.5–15.5)
WBC: 8.8 10*3/uL (ref 4.0–10.5)
nRBC: 0 % (ref 0.0–0.2)

## 2018-07-20 LAB — URINALYSIS, ROUTINE W REFLEX MICROSCOPIC
Bilirubin Urine: NEGATIVE
Glucose, UA: NEGATIVE mg/dL
Hgb urine dipstick: NEGATIVE
Ketones, ur: NEGATIVE mg/dL
Leukocytes,Ua: NEGATIVE
Nitrite: NEGATIVE
Protein, ur: NEGATIVE mg/dL
Specific Gravity, Urine: 1.025 (ref 1.005–1.030)
pH: 5 (ref 5.0–8.0)

## 2018-07-20 LAB — POCT PREGNANCY, URINE: Preg Test, Ur: POSITIVE — AB

## 2018-07-20 LAB — HCG, QUANTITATIVE, PREGNANCY: hCG, Beta Chain, Quant, S: 7311 m[IU]/mL — ABNORMAL HIGH (ref <5)

## 2018-07-20 MED ORDER — CYCLOBENZAPRINE HCL 10 MG PO TABS
10.0000 mg | ORAL_TABLET | Freq: Once | ORAL | Status: AC
  Administered 2018-07-20: 18:00:00 10 mg via ORAL
  Filled 2018-07-20: qty 1

## 2018-07-20 NOTE — MAU Provider Note (Signed)
History     CSN: [id number]  Arrival date and time: 07/20/18 1418   First Provider Initiated Contact with Patient 07/20/18 1538      Chief Complaint  Patient presents with  . Abdominal Pain  . Back Pain   [name] is a 38 y.o. G3P0020 at [redacted]w[redacted]d who presents to MAU for abdominal and bilateral side pain beginning last week. Pt reports pain is on and off. Pt denies anything that makes the pain better or worse. Pt reports the pain usually goes away in a couple of hours, but reports this pain has persisted since last night. Pt also reports she woke up this morning with low back pain. Pt describes her pain like a soreness. Pt denies trying any treatments at home. Pt denies any recent trauma.  Pt reports a hx of ectopic pregnancy and she is concerned this may be happening again, which is why she came to MAU today. Pt reports she was given methotrexate for her previous ectopic.  Pt denies VB, vaginal discharge/odor/itching. Pt denies N/V, constipation, diarrhea, or urinary problems. Pt denies fever, chills, fatigue, sweating or changes in appetite. Pt denies SOB or chest pain. Pt denies dizziness, HA, light-headedness, weakness.  Problems this pregnancy include: none. Allergies? pollen, NKDA Current medications/supplements? PNVs, Zyrtec Prenatal care provider? pt unsure if she would like to continue pregnancy, but requests lists of OB providers in area   OB History    Gravida  3   Para      Term      Preterm      AB  2   Living        SAB      TAB  1   Ectopic  1   Multiple      Live Births              Past Medical History:  Diagnosis Date  . Allergy   . Chlamydia   . Ectopic pregnancy 06/15/2016   Methotrexate  . Gonorrhea   . Infection    UTI  . MRSA infection 2008  . Trichomonas infection   . Vaginal Pap smear, abnormal    cone bx, ok since    Past Surgical History:  Procedure Laterality Date  . CERVICAL CONE BIOPSY      Family  History  Problem Relation Age of Onset  . Asthma Mother   . Hypertension Mother   . Hypertension Father   . Asthma Sister   . Asthma Brother     Social History   Tobacco Use  . Smoking status: Former Games developer  . Smokeless tobacco: Never Used  Substance Use Topics  . Alcohol use: Yes    Comment: occasional   . Drug use: No    Allergies: No Known Allergies  No medications prior to admission.    Review of Systems  Constitutional: Negative for appetite change, chills, diaphoresis, fatigue and fever.  Respiratory: Negative for shortness of breath.   Cardiovascular: Negative for chest pain.  Gastrointestinal: Positive for abdominal pain. Negative for constipation, diarrhea, nausea and vomiting.  Genitourinary: Positive for flank pain. Negative for dysuria, frequency, pelvic pain, urgency, vaginal bleeding and vaginal discharge.  Musculoskeletal: Positive for back pain.  Neurological: Negative for dizziness, weakness, light-headedness and headaches.   Physical Exam   Blood pressure 101/63, pulse 79, temperature 98.1 F (36.7 C), temperature source Oral, resp. rate 18, height 5' 5.5" (1.664 m), weight 62.3 kg, last menstrual period 06/15/2018, unknown if currently  breastfeeding.   Patient Vitals for the past 24 hrs:  BP Temp Temp src Pulse Resp Height Weight  07/20/18 1924 101/63 - - 79 18 - -  07/20/18 1440 110/68 98.1 F (36.7 C) Oral 68 16 5' 5.5" (1.664 m) 62.3 kg    Physical Exam  Constitutional: She is oriented to person, place, and time. She appears well-developed and well-nourished. No distress.  HENT:  Head: Normocephalic and atraumatic.  Respiratory: Effort normal and breath sounds normal.  GI: Soft. She exhibits no distension and no mass. There is no splenomegaly. There is abdominal tenderness in the periumbilical area and left upper quadrant. There is no rebound, no guarding and no CVA tenderness.    Genitourinary: There is no rash, tenderness or lesion on the  right labia. There is no rash, tenderness or lesion on the left labia. Uterus is not enlarged and not tender. Cervix exhibits no motion tenderness, no discharge and no friability. Right adnexum displays no mass, no tenderness and no fullness. Left adnexum displays no mass, no tenderness and no fullness.    No vaginal discharge, tenderness or bleeding.  No tenderness or bleeding in the vagina.  Neurological: She is alert and oriented to person, place, and time.  Skin: Skin is warm and dry. She is not diaphoretic.  Psychiatric: She has a normal mood and affect. Her behavior is normal.    MAU Course  Procedures  MDM -r/o ectopic -ABO: O positive -UA: WNL -CBC: H/H 11.9/35.8, CBC WNL for pregnancy -hCG: 1,6107,311 -US: +yolk sac, GS = 5513w5d, embryo/cardiac activity not visualized, small fibroid, recommend f/u US in 10-14days -flexeril given for abdominal pain/rib pain/LBP, pt told RN pain had improved, but told provider pain not improved -no identifiable cause for abdominal/rib/LBP pain, performed WetPrep, GC/CT -WetPrep: +ClueCells (isolated finding not requiring treatment), moderate WBCs -pt given options to seek further care for sx (PCP, [facility], urgent care) -pt discharged to home in stable condition  Orders Placed This Encounter  Procedures  . Wet prep, genital    Standing Status:   Standing    Number of Occurrences:   1  . US OB LESS THAN 14 WEEKS WITH OB TRANSVAGINAL    Standing Status:   Standing    Number of Occurrences:   1    Order Specific Question:   Symptom/Reason for Exam    Answer:   Pelvic pain in pregnancy [[id number]][[id number]]  . Urinalysis, Routine w reflex microscopic    Standing Status:   Standing    Number of Occurrences:   1  . CBC    Standing Status:   Standing    Number of Occurrences:   1  . hCG, quantitative, pregnancy    Standing Status:   Standing    Number of Occurrences:   1  . Pregnancy, urine POC    Standing Status:   Standing    Number of Occurrences:   1  .  Discharge patient    Order Specific Question:   Discharge disposition    Answer:   01-Home or Self Care [1]    Order Specific Question:   Discharge patient date    Answer:   07/20/2018   Meds ordered this encounter  Medications  . cyclobenzaprine (FLEXERIL) tablet 10 mg    Assessment and Plan   1. Abdominal pain during pregnancy in first trimester   2. Pelvic pain in pregnancy   3. [redacted] weeks gestation of pregnancy    Allergies as of 07/20/2018  No Known Allergies     Medication List    STOP taking these medications   doxycycline 100 MG capsule Commonly known as:  VIBRAMYCIN   fluconazole 150 MG tablet Commonly known as:  Diflucan   naproxen 500 MG tablet Commonly known as:  NAPROSYN   vitamin k 100 MCG tablet     TAKE these medications   calcium carbonate 1250 (500 Ca) MG chewable tablet Commonly known as:  OS-CAL Chew 1 tablet by mouth daily.   cetirizine 10 MG tablet Commonly known as:  ZYRTEC Take 1 tablet (10 mg total) by mouth daily.   cholecalciferol 25 MCG (1000 UT) tablet Commonly known as:  VITAMIN D3 Take 1,000 Units by mouth daily.   ferrous sulfate 325 (65 FE) MG tablet Take 325 mg by mouth daily with breakfast.      -list of OB providers given -pt advised to start taking PNVs -pt advised to seek care for non-OB sx at [facility], urgent care or with PCP -pt advised to have repeat US in 10-14days -return MAU precautions given for worsening/new/changed sx -pt agreeable with plan -pt discharged to home in stable condition  Joni Reining E  07/20/2018, 7:58 PM

## 2018-07-20 NOTE — MAU Note (Signed)
Pt C/O bloating & mid to upper abdominal pain, also pain on sides in rib area intermittently for the last week.  Also C/O lower back pain.  Denies pain.  Pos HPT on April 1.

## 2018-07-20 NOTE — Discharge Instructions (Signed)
° °  Abdominal Pain During Pregnancy ° °Abdominal pain is common during pregnancy, and has many possible causes. Some causes are more serious than others, and sometimes the cause is not known. Abdominal pain can be a sign that labor is starting. It can also be caused by normal growth and stretching of muscles and ligaments during pregnancy. Always tell your health care provider if you have any abdominal pain. °Follow these instructions at home: °· Do not have sex or put anything in your vagina until your pain goes away completely. °· Get plenty of rest until your pain improves. °· Drink enough fluid to keep your urine pale yellow. °· Take over-the-counter and prescription medicines only as told by your health care provider. °· Keep all follow-up visits as told by your health care provider. This is important. °Contact a health care provider if: °· Your pain continues or gets worse after resting. °· You have lower abdominal pain that: °? Comes and goes at regular intervals. °? Spreads to your back. °? Is similar to menstrual cramps. °· You have pain or burning when you urinate. °Get help right away if: °· You have a fever or chills. °· You have vaginal bleeding. °· You are leaking fluid from your vagina. °· You are passing tissue from your vagina. °· You have vomiting or diarrhea that lasts for more than 24 hours. °· Your baby is moving less than usual. °· You feel very weak or faint. °· You have shortness of breath. °· You develop severe pain in your upper abdomen. °Summary °· Abdominal pain is common during pregnancy, and has many possible causes. °· If you experience abdominal pain during pregnancy, tell your health care provider right away. °· Follow your health care provider's home care instructions and keep all follow-up visits as directed. °This information is not intended to replace advice given to you by your health care provider. Make sure you discuss any questions you have with your health care  provider. °Document Released: 03/30/2005 Document Revised: 07/02/2016 Document Reviewed: 07/02/2016 °Elsevier Interactive Patient Education © 2019 Elsevier Inc[employer] °[place] [facility]  ° ° ° °[facility]     Phone: [phone] ° °[facility]  Phone: [phone] ° °[facility]  Phone: [phone] ° °[facility]                           Phone: [phone] ° °[facility]          Phone: [phone] ° °[facility]    Phone: [phone]  ° °[facility]    Phone: [phone] ° °[facility]    Phone: [phone] ° °[facility]    Phone: [phone] ° °[facility]    Phone: [phone] ° °[facility]  Phone: [phone] ° °[facility]               Phone: [phone] ° °[facility]   Phone: [phone] ° °[facility]    Phone: [phone] ° ° ° ° °

## 2018-07-21 LAB — GC/CHLAMYDIA PROBE AMP (~~LOC~~) NOT AT ARMC
Chlamydia: NEGATIVE
Neisseria Gonorrhea: NEGATIVE

## 2018-09-27 ENCOUNTER — Ambulatory Visit: Payer: BLUE CROSS/BLUE SHIELD

## 2018-09-27 ENCOUNTER — Other Ambulatory Visit: Payer: Self-pay

## 2018-09-27 DIAGNOSIS — Z3480 Encounter for supervision of other normal pregnancy, unspecified trimester: Secondary | ICD-10-CM

## 2018-09-27 DIAGNOSIS — O0990 Supervision of high risk pregnancy, unspecified, unspecified trimester: Secondary | ICD-10-CM | POA: Insufficient documentation

## 2018-09-27 MED ORDER — BLOOD PRESSURE KIT: PACK | 0 refills | Status: DC

## 2018-09-27 NOTE — Progress Notes (Signed)
Virtual Visit via Telephone Note  I connected with Linda Moyer at 1:15 PM EDT by telephone and verified that I am speaking with the correct person using two identifiers.  I discussed the limitations, risks, security and privacy concerns of performing an evaluation and management service by telephone and the availability of in person appointments. I also discussed with the patient that there may be a patient responsible charge related to this service. The patient expressed understanding and agreed to proceed.  PRENATAL INTAKE SUMMARY  [name] presents today New OB Nurse Interview.  OB History     Gravida  3   Para     Term     Preterm  0   AB  2   Living  0      SAB     TAB  1   Ectopic  1   Multiple  0   Live Births  0          I have reviewed the patient's medical, obstetrical, social, and family histories, medications, and available lab results.  SUBJECTIVE Round ligament pain  OBJECTIVE Initial nurse interview for history (New OB)  GENERAL APPEARANCE Oriented to person, place and time, non-face to face interview  EDD: 03/22/2019 GA: [redacted]w[redacted]d G7P3033 FHT: non-face to face  ASSESSMENT/PLAN Normal pregnancy [facility]NC at [facility] Prenatal labs and PE at next visit with provider BP cuff ordered Advised pt to enroll in Babyscripts and My Chart   I discussed the assessment and treatment plan with the patient. The patient was provided an opportunity to ask questions and all were answered. The patient agreed with the plan and demonstrated an understanding of the instructions.   The patient was advised to call back or seek an in-person evaluation if the symptoms worsen or if the condition fails to improve as anticipated.  I provided 15 minutes of non-face-to-face time during this encounter.    Maryruth Eve, CMA

## 2018-09-27 NOTE — Progress Notes (Signed)
Agree with A & P. 

## 2018-10-12 ENCOUNTER — Encounter: Payer: Self-pay | Admitting: Certified Nurse Midwife

## 2018-10-12 ENCOUNTER — Ambulatory Visit (INDEPENDENT_AMBULATORY_CARE_PROVIDER_SITE_OTHER): Payer: BLUE CROSS/BLUE SHIELD | Admitting: Certified Nurse Midwife

## 2018-10-12 ENCOUNTER — Other Ambulatory Visit: Payer: Self-pay

## 2018-10-12 ENCOUNTER — Other Ambulatory Visit (HOSPITAL_COMMUNITY)
Admission: RE | Admit: 2018-10-12 | Discharge: 2018-10-12 | Disposition: A | Discharge status: Home / Self Care | Payer: BLUE CROSS/BLUE SHIELD | Source: Ambulatory Visit | Attending: Certified Nurse Midwife | Admitting: Certified Nurse Midwife

## 2018-10-12 VITALS — BP 118/77 | HR 87 | Temp 99.4°F | Wt 138.8 lb

## 2018-10-12 DIAGNOSIS — N760 Acute vaginitis: Secondary | ICD-10-CM

## 2018-10-12 DIAGNOSIS — O09529 Supervision of elderly multigravida, unspecified trimester: Secondary | ICD-10-CM | POA: Insufficient documentation

## 2018-10-12 DIAGNOSIS — O09522 Supervision of elderly multigravida, second trimester: Secondary | ICD-10-CM

## 2018-10-12 DIAGNOSIS — Z3482 Encounter for supervision of other normal pregnancy, second trimester: Secondary | ICD-10-CM | POA: Diagnosis not present

## 2018-10-12 DIAGNOSIS — Z3480 Encounter for supervision of other normal pregnancy, unspecified trimester: Secondary | ICD-10-CM

## 2018-10-12 DIAGNOSIS — O98912 Unspecified maternal infectious and parasitic disease complicating pregnancy, second trimester: Secondary | ICD-10-CM

## 2018-10-12 DIAGNOSIS — Z3A17 17 weeks gestation of pregnancy: Secondary | ICD-10-CM

## 2018-10-12 DIAGNOSIS — B9689 Other specified bacterial agents as the cause of diseases classified elsewhere: Secondary | ICD-10-CM

## 2018-10-12 NOTE — Progress Notes (Signed)
Pt presents for NOB.  This is not a planned pregnancy but FOB is supportive.  Pt has questions regarding EDD. Last u/s 07/20/2018

## 2018-10-12 NOTE — Progress Notes (Signed)
History:   Linda Moyer is a 38 y.o. G3P0020 at 1w0dby LMP being seen today for her first obstetrical visit. This is not a planned pregnancy.  Her obstetrical history is significant for advanced maternal age. Patient does intend to breast feed. Pregnancy history fully reviewed.   Patient reports no complaints.     HISTORY: OB History  Gravida Para Term Preterm AB Living  3 0 0 0 2 0  SAB TAB Ectopic Multiple Live Births  0 1 1 0 0    # Outcome Date GA Lbr Len/2nd Weight Sex Delivery Anes PTL Lv  3 Current           2 TAB           1 Ectopic             Last pap smear was done 06/2015 and was normal  Past Medical History:  Diagnosis Date  . Allergy   . Chlamydia   . Ectopic pregnancy 06/15/2016   Methotrexate  . Gonorrhea   . Infection    UTI  . MRSA infection 2008  . Trichomonas infection   . Vaginal Pap smear, abnormal    cone bx, ok since   Past Surgical History:  Procedure Laterality Date  . CERVICAL CONE BIOPSY     Family History  Problem Relation Age of Onset  . Asthma Mother   . Hypertension Mother   . Hypertension Father   . Asthma Sister   . Asthma Brother    Social History   Tobacco Use  . Smoking status: Former SResearch scientist (life sciences) . Smokeless tobacco: Never Used  Substance Use Topics  . Alcohol use: Yes    Comment: occasional   . Drug use: No   No Known Allergies Current Outpatient Medications on File Prior to Visit  Medication Sig Dispense Refill  . Blood Pressure KIT To Be Monitored Regularly at Home z34.90 Large cuff (Patient not taking: Reported on 10/12/2018) 1 each 0  . calcium carbonate (OS-CAL) 1250 (500 Ca) MG chewable tablet Chew 1 tablet by mouth daily.    . cetirizine (ZYRTEC) 10 MG tablet Take 1 tablet (10 mg total) by mouth daily. (Patient not taking: Reported on 09/27/2018) 10 tablet 0  . cholecalciferol (VITAMIN D3) 25 MCG (1000 UT) tablet Take 1,000 Units by mouth daily.    . ferrous sulfate 325 (65 FE) MG tablet Take 325 mg by mouth  daily with breakfast.    . Prenatal Vit w/Fe-Methylfol-FA (PNV PO) Take by mouth.     No current facility-administered medications on file prior to visit.     Review of Systems Pertinent items noted in HPI and remainder of comprehensive ROS otherwise negative. Physical Exam:   Vitals:   10/12/18 1607  BP: 118/77  Pulse: 87  Temp: 99.4 F (37.4 C)  Weight: 138 lb 12.8 oz (63 kg)   Fetal Heart Rate (bpm): 140 Pelvic Exam: Perineum: no hemorrhoids, normal perineum   Vulva: normal external genitalia, no lesions   Vagina:  normal mucosa, normal discharge   Cervix: no lesions and normal, pap smear done.    Adnexa: normal adnexa and no mass, fullness, tenderness   Bony Pelvis: average  System: General: well-developed, well-nourished female in no acute distress   Breasts:  normal appearance, no masses or tenderness bilaterally   Skin: normal coloration and turgor, no rashes   Neurologic: oriented, normal, negative, normal mood   Extremities: normal strength, tone, and muscle mass, ROM of  all joints is normal   HEENT PERRLA, extraocular movement intact and sclera clear   Mouth/Teeth mucous membranes moist, pharynx normal without lesions and dental hygiene good   Neck supple and no masses   Cardiovascular: regular rate and rhythm   Respiratory:  no respiratory distress, normal breath sounds   Abdomen: soft, non-tender; bowel sounds normal; no masses,  no organomegaly     Assessment:    Pregnancy: [id number] Patient Active Problem List   Diagnosis Date Noted  . AMA (advanced maternal age) multigravida 35+ 10/12/2018  . Encounter for supervision of normal pregnancy, antepartum 09/27/2018     Plan:    1. Supervision of other normal pregnancy, antepartum - Welcomed to practice and introduced self to patient  - Anticipatory guidance on upcoming appointments - COVID19 precautions  - Obstetric Panel, Including HIV - Culture, OB Urine - Genetic Screening - Cervicovaginal ancillary  only( [facility]) - Cytology - PAP( ) - [place] MFM OB DETAIL +14 WK; Future - Babyscripts Schedule Optimization  2. Antepartum multigravida of advanced maternal age  Initial labs drawn. Continue prenatal vitamins. Genetic Screening discussed, NIPS: ordered. Ultrasound discussed; fetal anatomic survey: ordered. Problem list reviewed and updated. The nature of [facility] with multiple MDs and other Advanced Practice Providers was explained to patient; also emphasized that residents, students are part of our team. Routine obstetric precautions reviewed. Return in about 4 weeks (around 11/09/2018) for ROB-webex .     Lajean Manes, [place] for [employer], [place]

## 2018-10-12 NOTE — Patient Instructions (Signed)
Safe Medications in Pregnancy   Acne: Benzoyl Peroxide Salicylic Acid  Backache/Headache: Tylenol: 2 regular strength every 4 hours OR              2 Extra strength every 6 hours  Colds/Coughs/Allergies: Benadryl (alcohol free) 25 mg every 6 hours as needed Breath right strips Claritin Cepacol throat lozenges Chloraseptic throat spray Cold-Eeze- up to three times per day Cough drops, alcohol free Flonase (by prescription only) Guaifenesin Mucinex Robitussin DM (plain only, alcohol free) Saline nasal spray/drops Sudafed (pseudoephedrine) & Actifed ** use only after [redacted] weeks gestation and if you do not have high blood pressure Tylenol Vicks Vaporub Zinc lozenges Zyrtec   Constipation: Colace Ducolax suppositories Fleet enema Glycerin suppositories Metamucil Milk of magnesia Miralax Senokot Smooth move tea  Diarrhea: Kaopectate Imodium A-D  *NO pepto Bismol  Hemorrhoids: Anusol Anusol HC Preparation H Tucks  Indigestion: Tums Maalox Mylanta Zantac  Pepcid  Insomnia: Benadryl (alcohol free) 25mg every 6 hours as needed Tylenol PM Unisom, no Gelcaps  Leg Cramps: Tums MagGel  Nausea/Vomiting:  Bonine Dramamine Emetrol Ginger extract Sea bands Meclizine  Nausea medication to take during pregnancy:  Unisom (doxylamine succinate 25 mg tablets) Take one tablet daily at bedtime. If symptoms are not adequately controlled, the dose can be increased to a maximum recommended dose of two tablets daily (1/2 tablet in the morning, 1/2 tablet mid-afternoon and one at bedtime). Vitamin B6 100mg tablets. Take one tablet twice a day (up to 200 mg per day).  Skin Rashes: Aveeno products Benadryl cream or 25mg every 6 hours as needed Calamine Lotion 1% cortisone cream  Yeast infection: Gyne-lotrimin 7 Monistat 7   **If taking multiple medications, please check labels to avoid duplicating the same active ingredients **take medication as directed on  the label ** Do not exceed 4000 mg of tylenol in 24 hours **Do not take medications that contain aspirin or ibuprofen     Second Trimester of Pregnancy  The second trimester is from week 14 through week 27 (month 4 through 6). This is often the time in pregnancy that you feel your best. Often times, morning sickness has lessened or quit. You may have more energy, and you may get hungry more often. Your unborn baby is growing rapidly. At the end of the sixth month, he or she is about 9 inches long and weighs about 1 pounds. You will likely feel the baby move between 18 and 20 weeks of pregnancy. Follow these instructions at home: Medicines  Take over-the-counter and prescription medicines only as told by your doctor. Some medicines are safe and some medicines are not safe during pregnancy.  Take a prenatal vitamin that contains at least 600 micrograms (mcg) of folic acid.  If you have trouble pooping (constipation), take medicine that will make your stool soft (stool softener) if your doctor approves. Eating and drinking   Eat regular, healthy meals.  Avoid raw meat and uncooked cheese.  If you get low calcium from the food you eat, talk to your doctor about taking a daily calcium supplement.  Avoid foods that are high in fat and sugars, such as fried and sweet foods.  If you feel sick to your stomach (nauseous) or throw up (vomit): ? Eat 4 or 5 small meals a day instead of 3 large meals. ? Try eating a few soda crackers. ? Drink liquids between meals instead of during meals.  To prevent constipation: ? Eat foods that are high in fiber, like fresh fruits   and vegetables, whole grains, and beans. ? Drink enough fluids to keep your pee (urine) clear or pale yellow. Activity  Exercise only as told by your doctor. Stop exercising if you start to have cramps.  Do not exercise if it is too hot, too humid, or if you are in a place of great height (high altitude).  Avoid heavy  lifting.  Wear low-heeled shoes. Sit and stand up straight.  You can continue to have sex unless your doctor tells you not to. Relieving pain and discomfort  Wear a good support bra if your breasts are tender.  Take warm water baths (sitz baths) to soothe pain or discomfort caused by hemorrhoids. Use hemorrhoid cream if your doctor approves.  Rest with your legs raised if you have leg cramps or low back pain.  If you develop puffy, bulging veins (varicose veins) in your legs: ? Wear support hose or compression stockings as told by your doctor. ? Raise (elevate) your feet for 15 minutes, 3-4 times a day. ? Limit salt in your food. Prenatal care  Write down your questions. Take them to your prenatal visits.  Keep all your prenatal visits as told by your doctor. This is important. Safety  Wear your seat belt when driving.  Make a list of emergency phone numbers, including numbers for family, friends, the hospital, and police and fire departments. General instructions  Ask your doctor about the right foods to eat or for help finding a counselor, if you need these services.  Ask your doctor about local prenatal classes. Begin classes before month 6 of your pregnancy.  Do not use hot tubs, steam rooms, or saunas.  Do not douche or use tampons or scented sanitary pads.  Do not cross your legs for long periods of time.  Visit your dentist if you have not done so. Use a soft toothbrush to brush your teeth. Floss gently.  Avoid all smoking, herbs, and alcohol. Avoid drugs that are not approved by your doctor.  Do not use any products that contain nicotine or tobacco, such as cigarettes and e-cigarettes. If you need help quitting, ask your doctor.  Avoid cat litter boxes and soil used by cats. These carry germs that can cause birth defects in the baby and can cause a loss of your baby (miscarriage) or stillbirth. Contact a doctor if:  You have mild cramps or pressure in your  lower belly.  You have pain when you pee (urinate).  You have bad smelling fluid coming from your vagina.  You continue to feel sick to your stomach (nauseous), throw up (vomit), or have watery poop (diarrhea).  You have a nagging pain in your belly area.  You feel dizzy. Get help right away if:  You have a fever.  You are leaking fluid from your vagina.  You have spotting or bleeding from your vagina.  You have severe belly cramping or pain.  You lose or gain weight rapidly.  You have trouble catching your breath and have chest pain.  You notice sudden or extreme puffiness (swelling) of your face, hands, ankles, feet, or legs.  You have not felt the baby move in over an hour.  You have severe headaches that do not go away when you take medicine.  You have trouble seeing. Summary  The second trimester is from week 14 through week 27 (months 4 through 6). This is often the time in pregnancy that you feel your best.  To take care of yourself and  your unborn baby, you will need to eat healthy meals, take medicines only if your doctor tells you to do so, and do activities that are safe for you and your baby.  Call your doctor if you get sick or if you notice anything unusual about your pregnancy. Also, call your doctor if you need help with the right food to eat, or if you want to know what activities are safe for you. This information is not intended to replace advice given to you by your health care provider. Make sure you discuss any questions you have with your health care provider. Document Released: 06/24/2009 Document Revised: 07/22/2018 Document Reviewed: 05/05/2016 Elsevier Patient Education  2020 Elsevier Inc.  

## 2018-10-13 LAB — CERVICOVAGINAL ANCILLARY ONLY
Bacterial vaginitis: POSITIVE — AB
Candida vaginitis: NEGATIVE

## 2018-10-14 LAB — OBSTETRIC PANEL, INCLUDING HIV
Antibody Screen: NEGATIVE
Basophils Absolute: 0 10*3/uL (ref 0.0–0.2)
Basos: 0 %
EOS (ABSOLUTE): 0.3 10*3/uL (ref 0.0–0.4)
Eos: 2 %
HIV Screen 4th Generation wRfx: NONREACTIVE
Hematocrit: 37.2 % (ref 34.0–46.6)
Hemoglobin: 12.6 g/dL (ref 11.1–15.9)
Hepatitis B Surface Ag: NEGATIVE
Immature Grans (Abs): 0 10*3/uL (ref 0.0–0.1)
Immature Granulocytes: 0 %
Lymphocytes Absolute: 3.8 10*3/uL — ABNORMAL HIGH (ref 0.7–3.1)
Lymphs: 28 %
MCH: 30.4 pg (ref 26.6–33.0)
MCHC: 33.9 g/dL (ref 31.5–35.7)
MCV: 90 fL (ref 79–97)
Monocytes Absolute: 0.9 10*3/uL (ref 0.1–0.9)
Monocytes: 7 %
Neutrophils Absolute: 8.5 10*3/uL — ABNORMAL HIGH (ref 1.4–7.0)
Neutrophils: 63 %
Platelets: 247 10*3/uL (ref 150–450)
RBC: 4.15 x10E6/uL (ref 3.77–5.28)
RDW: 11.8 % (ref 11.7–15.4)
RPR Ser Ql: NONREACTIVE
Rh Factor: POSITIVE
Rubella Antibodies, IGG: 4.19 index (ref >0.99)
WBC: 13.7 10*3/uL — ABNORMAL HIGH (ref 3.4–10.8)

## 2018-10-14 LAB — CULTURE, OB URINE

## 2018-10-14 LAB — URINE CULTURE, OB REFLEX: Organism ID, Bacteria: NO GROWTH

## 2018-10-17 MED ORDER — METRONIDAZOLE 500 MG PO TABS: 500.0000 mg | ORAL_TABLET | Freq: Two times a day (BID) | ORAL | 0 refills | Status: DC

## 2018-10-17 NOTE — Addendum Note (Signed)
Addended by: Lajean Manes on: 10/17/2018 03:10 AM   Modules accepted: Orders

## 2018-10-19 LAB — CYTOLOGY - PAP
Chlamydia: NEGATIVE
Diagnosis: NEGATIVE
HPV: NOT DETECTED
Neisseria Gonorrhea: NEGATIVE
Trichomonas: NEGATIVE

## 2018-10-20 ENCOUNTER — Encounter: Payer: Self-pay | Admitting: Certified Nurse Midwife

## 2018-10-24 ENCOUNTER — Encounter: Payer: Self-pay | Admitting: Certified Nurse Midwife

## 2018-10-25 ENCOUNTER — Telehealth: Payer: Self-pay | Admitting: *Deleted

## 2018-10-25 NOTE — Telephone Encounter (Signed)
Pt called to office stating Linda Moyer recommended medication that she may take at night to help with back pain and constipation.  Did not see recommendation in pt chart.  Pt was advised she may take Tylenol and use of Miralax or stool softener.  Pt states she is not sleeping well at night due to her back and hip pain.  Pt was made aware it is safe for her to take Tylenol PM as well and may help her rest at night.  Pt advised to try OTC tx and to let office no if no change in her symptoms.

## 2018-10-26 ENCOUNTER — Encounter (HOSPITAL_COMMUNITY): Payer: Self-pay

## 2018-10-28 ENCOUNTER — Ambulatory Visit (HOSPITAL_COMMUNITY)
Admission: RE | Admit: 2018-10-28 | Discharge: 2018-10-28 | Disposition: A | Discharge status: Home / Self Care | Payer: BLUE CROSS/BLUE SHIELD | Source: Ambulatory Visit | Attending: Obstetrics and Gynecology | Admitting: Obstetrics and Gynecology

## 2018-10-28 ENCOUNTER — Encounter (HOSPITAL_COMMUNITY): Payer: Self-pay

## 2018-10-28 ENCOUNTER — Ambulatory Visit (HOSPITAL_COMMUNITY): Payer: BLUE CROSS/BLUE SHIELD | Admitting: *Deleted

## 2018-10-28 ENCOUNTER — Other Ambulatory Visit: Payer: Self-pay

## 2018-10-28 DIAGNOSIS — Z3480 Encounter for supervision of other normal pregnancy, unspecified trimester: Secondary | ICD-10-CM | POA: Diagnosis not present

## 2018-10-28 DIAGNOSIS — O09522 Supervision of elderly multigravida, second trimester: Secondary | ICD-10-CM

## 2018-10-28 DIAGNOSIS — Z3A19 19 weeks gestation of pregnancy: Secondary | ICD-10-CM

## 2018-10-28 DIAGNOSIS — O09529 Supervision of elderly multigravida, unspecified trimester: Secondary | ICD-10-CM

## 2018-10-31 ENCOUNTER — Other Ambulatory Visit (HOSPITAL_COMMUNITY): Payer: Self-pay | Admitting: *Deleted

## 2018-10-31 DIAGNOSIS — O402XX0 Polyhydramnios, second trimester, not applicable or unspecified: Secondary | ICD-10-CM

## 2018-11-03 ENCOUNTER — Telehealth: Payer: Self-pay
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
## 2018-11-04 ENCOUNTER — Other Ambulatory Visit: Payer: Self-pay

## 2018-11-04 ENCOUNTER — Inpatient Hospital Stay (HOSPITAL_COMMUNITY)
Admission: AD | Admit: 2018-11-04 | Discharge: 2018-11-04 | Disposition: A | Discharge status: Home / Self Care | Payer: BLUE CROSS/BLUE SHIELD | Attending: Obstetrics & Gynecology | Admitting: Obstetrics & Gynecology

## 2018-11-04 ENCOUNTER — Encounter (HOSPITAL_COMMUNITY): Payer: Self-pay | Admitting: Advanced Practice Midwife

## 2018-11-04 DIAGNOSIS — Z3492 Encounter for supervision of normal pregnancy, unspecified, second trimester: Secondary | ICD-10-CM

## 2018-11-04 DIAGNOSIS — O26892 Other specified pregnancy related conditions, second trimester: Secondary | ICD-10-CM | POA: Insufficient documentation

## 2018-11-04 DIAGNOSIS — O09529 Supervision of elderly multigravida, unspecified trimester: Secondary | ICD-10-CM

## 2018-11-04 DIAGNOSIS — O9A212 Injury, poisoning and certain other consequences of external causes complicating pregnancy, second trimester: Secondary | ICD-10-CM | POA: Diagnosis not present

## 2018-11-04 DIAGNOSIS — Z3A20 20 weeks gestation of pregnancy: Secondary | ICD-10-CM | POA: Diagnosis not present

## 2018-11-04 DIAGNOSIS — Z87891 Personal history of nicotine dependence: Secondary | ICD-10-CM | POA: Insufficient documentation

## 2018-11-04 DIAGNOSIS — R109 Unspecified abdominal pain: Secondary | ICD-10-CM | POA: Diagnosis present

## 2018-11-04 LAB — URINALYSIS, ROUTINE W REFLEX MICROSCOPIC
Bilirubin Urine: NEGATIVE
Glucose, UA: NEGATIVE mg/dL
Hgb urine dipstick: NEGATIVE
Ketones, ur: NEGATIVE mg/dL
Leukocytes,Ua: NEGATIVE
Nitrite: NEGATIVE
Protein, ur: NEGATIVE mg/dL
Specific Gravity, Urine: 1.016 (ref 1.005–1.030)
pH: 6 (ref 5.0–8.0)

## 2018-11-04 MED ORDER — ACETAMINOPHEN 500 MG PO TABS
1000.0000 mg | ORAL_TABLET | Freq: Once | ORAL | Status: AC
  Administered 2018-11-04: 1000 mg via ORAL
  Filled 2018-11-04: qty 2
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
## 2018-11-04 NOTE — Discharge Instructions (Signed)
Abdominal Pain During Pregnancy ° °Abdominal pain is common during pregnancy, and has many possible causes. Some causes are more serious than others, and sometimes the cause is not known. Abdominal pain can be a sign that labor is starting. It can also be caused by normal growth and stretching of muscles and ligaments during pregnancy. Always tell your health care provider if you have any abdominal pain. °Follow these instructions at home: °· Do not have sex or put anything in your vagina until your pain goes away completely. °· Get plenty of rest until your pain improves. °· Drink enough fluid to keep your urine pale yellow. °· Take over-the-counter and prescription medicines only as told by your health care provider. °· Keep all follow-up visits as told by your health care provider. This is important. °Contact a health care provider if: °· Your pain continues or gets worse after resting. °· You have lower abdominal pain that: °? Comes and goes at regular intervals. °? Spreads to your back. °? Is similar to menstrual cramps. °· You have pain or burning when you urinate. °Get help right away if: °· You have a fever or chills. °· You have vaginal bleeding. °· You are leaking fluid from your vagina. °· You are passing tissue from your vagina. °· You have vomiting or diarrhea that lasts for more than 24 hours. °· Your baby is moving less than usual. °· You feel very weak or faint. °· You have shortness of breath. °· You develop severe pain in your upper abdomen. °Summary °· Abdominal pain is common during pregnancy, and has many possible causes. °· If you experience abdominal pain during pregnancy, tell your health care provider right away. °· Follow your health care provider's home care instructions and keep all follow-up visits as directed. °This information is not intended to replace advice given to you by your health care provider. Make sure you discuss any questions you have with your health care  provider. °Document Released: 03/30/2005 Document Revised: 07/18/2018 Document Reviewed: 07/02/2016 °Elsevier Patient Education © 2020 Elsevier Inc. ° °
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
## 2018-11-08 ENCOUNTER — Telehealth: Payer: Self-pay

## 2018-11-08 NOTE — Telephone Encounter (Signed)
Return call to pt regarding message pt not ava left detailed message for pt to return call.
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
## 2018-11-09 ENCOUNTER — Ambulatory Visit (INDEPENDENT_AMBULATORY_CARE_PROVIDER_SITE_OTHER): Payer: BLUE CROSS/BLUE SHIELD | Admitting: Certified Nurse Midwife

## 2018-11-09 DIAGNOSIS — O409XX0 Polyhydramnios, unspecified trimester, not applicable or unspecified: Secondary | ICD-10-CM | POA: Insufficient documentation

## 2018-11-09 DIAGNOSIS — Z3A21 21 weeks gestation of pregnancy: Secondary | ICD-10-CM

## 2018-11-09 DIAGNOSIS — O402XX0 Polyhydramnios, second trimester, not applicable or unspecified: Secondary | ICD-10-CM

## 2018-11-09 DIAGNOSIS — O09522 Supervision of elderly multigravida, second trimester: Secondary | ICD-10-CM

## 2018-11-09 DIAGNOSIS — Z3480 Encounter for supervision of other normal pregnancy, unspecified trimester: Secondary | ICD-10-CM

## 2018-11-09 HISTORY — DX: Polyhydramnios, unspecified trimester, not applicable or unspecified: O40.9XX0

## 2018-11-09 MED ORDER — COMFORT FIT MATERNITY SUPP SM MISC: 1.0000 [IU] | Freq: Every day | 0 refills | Status: DC | PRN
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
## 2018-11-09 NOTE — Patient Instructions (Signed)
Glucose Tolerance Test During Pregnancy Why am I having this test? The glucose tolerance test (GTT) is done to check how your body processes sugar (glucose). This is one of several tests used to diagnose diabetes that develops during pregnancy (gestational diabetes mellitus). Gestational diabetes is a temporary form of diabetes that some women develop during pregnancy. It usually occurs during the second trimester of pregnancy and goes away after delivery. Testing (screening) for gestational diabetes usually occurs between 24 and 28 weeks of pregnancy. You may have the GTT test after having a 1-hour glucose screening test if the results from that test indicate that you may have gestational diabetes. You may also have this test if:  You have a history of gestational diabetes.  You have a history of giving birth to very large babies or have experienced repeated fetal loss (stillbirth).  You have signs and symptoms of diabetes, such as: ? Changes in your vision. ? Tingling or numbness in your hands or feet. ? Changes in hunger, thirst, and urination that are not otherwise explained by your pregnancy. What is being tested? This test measures the amount of glucose in your blood at different times during a period of 3 hours. This indicates how well your body is able to process glucose. What kind of sample is taken?  Blood samples are required for this test. They are usually collected by inserting a needle into a blood vessel. How do I prepare for this test?  For 3 days before your test, eat normally. Have plenty of carbohydrate-rich foods.  Follow instructions from your health care provider about: ? Eating or drinking restrictions on the day of the test. You may be asked to not eat or drink anything other than water (fast) starting 8-10 hours before the test. ? Changing or stopping your regular medicines. Some medicines may interfere with this test. Tell a health care provider about:  All  medicines you are taking, including vitamins, herbs, eye drops, creams, and over-the-counter medicines.  Any blood disorders you have.  Any surgeries you have had.  Any medical conditions you have. What happens during the test? First, your blood glucose will be measured. This is referred to as your fasting blood glucose, since you fasted before the test. Then, you will drink a glucose solution that contains a certain amount of glucose. Your blood glucose will be measured again 1, 2, and 3 hours after drinking the solution. This test takes about 3 hours to complete. You will need to stay at the testing location during this time. During the testing period:  Do not eat or drink anything other than the glucose solution.  Do not exercise.  Do not use any products that contain nicotine or tobacco, such as cigarettes and e-cigarettes. If you need help stopping, ask your health care provider. The testing procedure may vary among health care providers and hospitals. How are the results reported? Your results will be reported as milligrams of glucose per deciliter of blood (mg/dL) or millimoles per liter (mmol/L). Your health care provider will compare your results to normal ranges that were established after testing a large group of people (reference ranges). Reference ranges may vary among labs and hospitals. For this test, common reference ranges are:  Fasting: less than 95-105 mg/dL (5.3-5.8 mmol/L).  1 hour after drinking glucose: less than 180-190 mg/dL (10.0-10.5 mmol/L).  2 hours after drinking glucose: less than 155-165 mg/dL (8.6-9.2 mmol/L).  3 hours after drinking glucose: 140-145 mg/dL (7.8-8.1 mmol/L). What do the   results mean? Results within reference ranges are considered normal, meaning that your glucose levels are well-controlled. If two or more of your blood glucose levels are high, you may be diagnosed with gestational diabetes. If only one level is high, your health care  provider may suggest repeat testing or other tests to confirm a diagnosis. Talk with your health care provider about what your results mean. Questions to ask your health care provider Ask your health care provider, or the department that is doing the test:  When will my results be ready?  How will I get my results?  What are my treatment options?  What other tests do I need?  What are my next steps? Summary  The glucose tolerance test (GTT) is one of several tests used to diagnose diabetes that develops during pregnancy (gestational diabetes mellitus). Gestational diabetes is a temporary form of diabetes that some women develop during pregnancy.  You may have the GTT test after having a 1-hour glucose screening test if the results from that test indicate that you may have gestational diabetes. You may also have this test if you have any symptoms or risk factors for gestational diabetes.  Talk with your health care provider about what your results mean. This information is not intended to replace advice given to you by your health care provider. Make sure you discuss any questions you have with your health care provider. Document Released: 09/29/2011 Document Revised: 07/21/2018 Document Reviewed: 11/09/2016 Elsevier Patient Education  2020 Else[employer] Inc.  

## 2018-11-09 NOTE — Progress Notes (Signed)
Pt would like return to work.

## 2018-11-25 ENCOUNTER — Ambulatory Visit (HOSPITAL_COMMUNITY): Payer: BLUE CROSS/BLUE SHIELD | Admitting: *Deleted

## 2018-11-25 ENCOUNTER — Ambulatory Visit (HOSPITAL_COMMUNITY)
Admission: RE | Admit: 2018-11-25 | Discharge: 2018-11-25 | Disposition: A | Discharge status: Home / Self Care | Payer: BLUE CROSS/BLUE SHIELD | Source: Ambulatory Visit | Attending: Obstetrics and Gynecology | Admitting: Obstetrics and Gynecology

## 2018-11-25 ENCOUNTER — Other Ambulatory Visit (HOSPITAL_COMMUNITY): Payer: Self-pay | Admitting: Maternal & Fetal Medicine

## 2018-11-25 ENCOUNTER — Other Ambulatory Visit: Payer: Self-pay

## 2018-11-25 ENCOUNTER — Encounter (HOSPITAL_COMMUNITY): Payer: Self-pay

## 2018-11-25 ENCOUNTER — Other Ambulatory Visit (HOSPITAL_COMMUNITY): Payer: Self-pay | Admitting: *Deleted

## 2018-11-25 DIAGNOSIS — Z362 Encounter for other antenatal screening follow-up: Secondary | ICD-10-CM

## 2018-11-25 DIAGNOSIS — Z3A23 23 weeks gestation of pregnancy: Secondary | ICD-10-CM

## 2018-11-25 DIAGNOSIS — O409XX0 Polyhydramnios, unspecified trimester, not applicable or unspecified: Secondary | ICD-10-CM

## 2018-11-25 DIAGNOSIS — O402XX1 Polyhydramnios, second trimester, fetus 1: Secondary | ICD-10-CM

## 2018-11-25 DIAGNOSIS — O09522 Supervision of elderly multigravida, second trimester: Secondary | ICD-10-CM | POA: Diagnosis not present

## 2018-11-25 DIAGNOSIS — O402XX0 Polyhydramnios, second trimester, not applicable or unspecified: Secondary | ICD-10-CM | POA: Insufficient documentation

## 2018-11-25 DIAGNOSIS — O365990 Maternal care for other known or suspected poor fetal growth, unspecified trimester, not applicable or unspecified: Secondary | ICD-10-CM

## 2018-11-25 DIAGNOSIS — O365920 Maternal care for other known or suspected poor fetal growth, second trimester, not applicable or unspecified: Secondary | ICD-10-CM | POA: Diagnosis not present

## 2018-12-09 ENCOUNTER — Ambulatory Visit (HOSPITAL_COMMUNITY): Payer: BLUE CROSS/BLUE SHIELD | Admitting: *Deleted

## 2018-12-09 ENCOUNTER — Encounter (HOSPITAL_COMMUNITY): Payer: Self-pay

## 2018-12-09 ENCOUNTER — Other Ambulatory Visit (HOSPITAL_COMMUNITY): Payer: Self-pay | Admitting: *Deleted

## 2018-12-09 ENCOUNTER — Ambulatory Visit (HOSPITAL_COMMUNITY)
Admission: RE | Admit: 2018-12-09 | Discharge: 2018-12-09 | Disposition: A | Discharge status: Home / Self Care | Payer: BLUE CROSS/BLUE SHIELD | Source: Ambulatory Visit | Attending: Obstetrics and Gynecology | Admitting: Obstetrics and Gynecology

## 2018-12-09 ENCOUNTER — Other Ambulatory Visit: Payer: Self-pay

## 2018-12-09 DIAGNOSIS — O409XX0 Polyhydramnios, unspecified trimester, not applicable or unspecified: Secondary | ICD-10-CM | POA: Diagnosis present

## 2018-12-09 DIAGNOSIS — O365990 Maternal care for other known or suspected poor fetal growth, unspecified trimester, not applicable or unspecified: Secondary | ICD-10-CM | POA: Diagnosis present

## 2018-12-09 DIAGNOSIS — Z3A25 25 weeks gestation of pregnancy: Secondary | ICD-10-CM | POA: Diagnosis not present

## 2018-12-09 DIAGNOSIS — O365920 Maternal care for other known or suspected poor fetal growth, second trimester, not applicable or unspecified: Secondary | ICD-10-CM | POA: Diagnosis not present

## 2018-12-09 DIAGNOSIS — O09522 Supervision of elderly multigravida, second trimester: Secondary | ICD-10-CM | POA: Diagnosis present

## 2018-12-09 DIAGNOSIS — O402XX1 Polyhydramnios, second trimester, fetus 1: Secondary | ICD-10-CM

## 2018-12-12 ENCOUNTER — Ambulatory Visit (HOSPITAL_COMMUNITY): Payer: BLUE CROSS/BLUE SHIELD

## 2018-12-12 ENCOUNTER — Other Ambulatory Visit: Payer: Self-pay

## 2018-12-12 DIAGNOSIS — O09522 Supervision of elderly multigravida, second trimester: Secondary | ICD-10-CM | POA: Diagnosis present

## 2018-12-12 DIAGNOSIS — O409XX0 Polyhydramnios, unspecified trimester, not applicable or unspecified: Secondary | ICD-10-CM | POA: Diagnosis not present

## 2018-12-12 MED ORDER — BETAMETHASONE SOD PHOS & ACET 6 (3-3) MG/ML IJ SUSP
12.0000 mg | Freq: Once | INTRAMUSCULAR | Status: AC
  Administered 2018-12-12: 12 mg via INTRAMUSCULAR

## 2018-12-12 NOTE — Progress Notes (Signed)
Patient received BMZ injection after much discussion. Patient said she hated needles and thought she was going to have lab draw. Explained to patient the reason injection was ordered and the benefits to her baby should it be necessary to deliver baby earlier than due date or in the event she goes into labor early. She was assured that she will not be forced or coerced in any way with regards to the medication. Patient agreed to receive med and was about to receive injection when she then said, "I can't do this." This happened twice before she actually received injection. In the course of conversation, patient stated that she just wanted a c-section and get it over with, that she did not want to to push him out. She stated that she never wanted to be pregnant and did not want the baby. When asked if she was going to care for the baby once he arrives or did she have a plan, she shrugged and said, I don't know."

## 2018-12-13 ENCOUNTER — Ambulatory Visit (HOSPITAL_COMMUNITY): Payer: BLUE CROSS/BLUE SHIELD | Admitting: *Deleted

## 2018-12-13 ENCOUNTER — Encounter (HOSPITAL_COMMUNITY): Payer: Self-pay

## 2018-12-13 ENCOUNTER — Other Ambulatory Visit: Payer: Self-pay

## 2018-12-13 ENCOUNTER — Ambulatory Visit (HOSPITAL_COMMUNITY): Payer: BLUE CROSS/BLUE SHIELD

## 2018-12-13 ENCOUNTER — Ambulatory Visit (HOSPITAL_COMMUNITY)
Admission: RE | Admit: 2018-12-13 | Discharge: 2018-12-13 | Disposition: A | Discharge status: Home / Self Care | Payer: BLUE CROSS/BLUE SHIELD | Source: Ambulatory Visit | Attending: Obstetrics and Gynecology | Admitting: Obstetrics and Gynecology

## 2018-12-13 ENCOUNTER — Other Ambulatory Visit (HOSPITAL_COMMUNITY): Payer: Self-pay | Admitting: Maternal & Fetal Medicine

## 2018-12-13 DIAGNOSIS — O409XX0 Polyhydramnios, unspecified trimester, not applicable or unspecified: Secondary | ICD-10-CM

## 2018-12-13 DIAGNOSIS — O365990 Maternal care for other known or suspected poor fetal growth, unspecified trimester, not applicable or unspecified: Secondary | ICD-10-CM | POA: Diagnosis not present

## 2018-12-13 DIAGNOSIS — Z3A25 25 weeks gestation of pregnancy: Secondary | ICD-10-CM

## 2018-12-13 DIAGNOSIS — O402XX1 Polyhydramnios, second trimester, fetus 1: Secondary | ICD-10-CM

## 2018-12-13 DIAGNOSIS — O09522 Supervision of elderly multigravida, second trimester: Secondary | ICD-10-CM

## 2018-12-13 DIAGNOSIS — O365920 Maternal care for other known or suspected poor fetal growth, second trimester, not applicable or unspecified: Secondary | ICD-10-CM

## 2018-12-13 MED ORDER — BETAMETHASONE SOD PHOS & ACET 6 (3-3) MG/ML IJ SUSP
12.0000 mg | Freq: Once | INTRAMUSCULAR | Status: AC
  Administered 2018-12-13: 12 mg via INTRAMUSCULAR

## 2018-12-16 ENCOUNTER — Ambulatory Visit (HOSPITAL_COMMUNITY)
Admission: RE | Admit: 2018-12-16 | Discharge: 2018-12-16 | Disposition: A | Discharge status: Home / Self Care | Payer: BLUE CROSS/BLUE SHIELD | Source: Ambulatory Visit | Attending: Obstetrics and Gynecology | Admitting: Obstetrics and Gynecology

## 2018-12-16 ENCOUNTER — Other Ambulatory Visit: Payer: Self-pay

## 2018-12-16 ENCOUNTER — Ambulatory Visit (HOSPITAL_COMMUNITY): Payer: BLUE CROSS/BLUE SHIELD | Admitting: *Deleted

## 2018-12-16 ENCOUNTER — Ambulatory Visit (HOSPITAL_COMMUNITY): Payer: BLUE CROSS/BLUE SHIELD

## 2018-12-16 ENCOUNTER — Encounter (HOSPITAL_COMMUNITY): Payer: Self-pay

## 2018-12-16 DIAGNOSIS — O365920 Maternal care for other known or suspected poor fetal growth, second trimester, not applicable or unspecified: Secondary | ICD-10-CM

## 2018-12-16 DIAGNOSIS — O409XX0 Polyhydramnios, unspecified trimester, not applicable or unspecified: Secondary | ICD-10-CM | POA: Insufficient documentation

## 2018-12-16 DIAGNOSIS — O289 Unspecified abnormal findings on antenatal screening of mother: Secondary | ICD-10-CM | POA: Diagnosis not present

## 2018-12-16 DIAGNOSIS — O365990 Maternal care for other known or suspected poor fetal growth, unspecified trimester, not applicable or unspecified: Secondary | ICD-10-CM | POA: Diagnosis not present

## 2018-12-16 DIAGNOSIS — Z3A26 26 weeks gestation of pregnancy: Secondary | ICD-10-CM

## 2018-12-16 DIAGNOSIS — O09522 Supervision of elderly multigravida, second trimester: Secondary | ICD-10-CM | POA: Insufficient documentation

## 2018-12-16 DIAGNOSIS — Z362 Encounter for other antenatal screening follow-up: Secondary | ICD-10-CM | POA: Diagnosis not present

## 2018-12-20 ENCOUNTER — Ambulatory Visit (HOSPITAL_COMMUNITY): Payer: BLUE CROSS/BLUE SHIELD | Admitting: *Deleted

## 2018-12-20 ENCOUNTER — Other Ambulatory Visit: Payer: Self-pay

## 2018-12-20 ENCOUNTER — Other Ambulatory Visit (HOSPITAL_COMMUNITY): Payer: Self-pay | Admitting: *Deleted

## 2018-12-20 ENCOUNTER — Other Ambulatory Visit (HOSPITAL_COMMUNITY): Payer: Self-pay | Admitting: Obstetrics

## 2018-12-20 ENCOUNTER — Encounter (HOSPITAL_COMMUNITY): Payer: Self-pay | Admitting: *Deleted

## 2018-12-20 ENCOUNTER — Ambulatory Visit (HOSPITAL_COMMUNITY)
Admission: RE | Admit: 2018-12-20 | Discharge: 2018-12-20 | Disposition: A | Discharge status: Home / Self Care | Payer: BLUE CROSS/BLUE SHIELD | Source: Ambulatory Visit | Attending: Obstetrics | Admitting: Obstetrics

## 2018-12-20 DIAGNOSIS — O365990 Maternal care for other known or suspected poor fetal growth, unspecified trimester, not applicable or unspecified: Secondary | ICD-10-CM | POA: Diagnosis not present

## 2018-12-20 DIAGNOSIS — O09522 Supervision of elderly multigravida, second trimester: Secondary | ICD-10-CM | POA: Insufficient documentation

## 2018-12-20 DIAGNOSIS — O409XX0 Polyhydramnios, unspecified trimester, not applicable or unspecified: Secondary | ICD-10-CM | POA: Insufficient documentation

## 2018-12-21 ENCOUNTER — Other Ambulatory Visit: Payer: BLUE CROSS/BLUE SHIELD

## 2018-12-21 ENCOUNTER — Other Ambulatory Visit: Payer: Self-pay

## 2018-12-21 ENCOUNTER — Encounter: Payer: Self-pay | Admitting: Obstetrics and Gynecology

## 2018-12-21 ENCOUNTER — Ambulatory Visit (INDEPENDENT_AMBULATORY_CARE_PROVIDER_SITE_OTHER): Payer: BLUE CROSS/BLUE SHIELD | Admitting: Obstetrics and Gynecology

## 2018-12-21 VITALS — BP 120/81 | HR 88 | Wt 152.0 lb

## 2018-12-21 DIAGNOSIS — Z3A27 27 weeks gestation of pregnancy: Secondary | ICD-10-CM

## 2018-12-21 DIAGNOSIS — Z3480 Encounter for supervision of other normal pregnancy, unspecified trimester: Secondary | ICD-10-CM

## 2018-12-21 DIAGNOSIS — O409XX0 Polyhydramnios, unspecified trimester, not applicable or unspecified: Secondary | ICD-10-CM

## 2018-12-21 DIAGNOSIS — O09522 Supervision of elderly multigravida, second trimester: Secondary | ICD-10-CM

## 2018-12-21 NOTE — Progress Notes (Signed)
   PRENATAL VISIT NOTE  Subjective:  Linda Moyer is a 38 y.o. G3P0020 at [redacted]w[redacted]d being seen today for ongoing prenatal care.  She is currently monitored for the following issues for this high-risk pregnancy and has Encounter for supervision of normal pregnancy, antepartum; AMA (advanced maternal age) multigravida 35+; and Polyhydramnios affecting pregnancy on their problem list.  Patient reports no complaints.  Contractions: Not present. Vag. Bleeding: None.  Movement: Present. Denies leaking of fluid.   The following portions of the patient's history were reviewed and updated as appropriate: allergies, current medications, past family history, past medical history, past social history, past surgical history and problem list.   Objective:   Vitals:   12/21/18 0935  BP: 120/81  Pulse: 88  Weight: 152 lb (68.9 kg)    Fetal Status: Fetal Heart Rate (bpm): 144 Fundal Height: 27 cm Movement: Present     General:  Alert, oriented and cooperative. Patient is in no acute distress.  Skin: Skin is warm and dry. No rash noted.   Cardiovascular: Normal heart rate noted  Respiratory: Normal respiratory effort, no problems with respiration noted  Abdomen: Soft, gravid, appropriate for gestational age.  Pain/Pressure: Absent     Pelvic: Cervical exam deferred        Extremities: Normal range of motion.  Edema: None  Mental Status: Normal mood and affect. Normal behavior. Normal judgment and thought content.   Assessment and Plan:  Pregnancy: P5T6144 at [redacted]w[redacted]d 1. Supervision of other normal pregnancy, antepartum Patient is doing well without complaints BTL form signed today Patient to reschedule glucola prior to next appointment Patient undecided on contraception  2. Polyhydramnios affecting pregnancy Follow up ultrasound on 9/11  3. Multigravida of advanced maternal age in second trimester   Preterm labor symptoms and general obstetric precautions including but not limited to vaginal  bleeding, contractions, leaking of fluid and fetal movement were reviewed in detail with the patient. Please refer to After Visit Summary for other counseling recommendations.   Return in about 2 weeks (around 01/04/2019) for [facility] [place], Low risk.  Future Appointments  Date Time Provider [place]  12/22/2018  8:15 AM [facility] None  12/23/2018  1:45 PM [place] [facility]-US  12/23/2018  1:45 PM [facility] [place] 4 [facility]-MFCUS MFC-US  12/23/2018  2:15 PM [facility] NST [place] MFC-US  12/27/2018  1:30 PM [place] MFC-US  12/27/2018  1:30 PM [facility] [place] 1 [facility] MFC-US  12/30/2018  2:00 PM [place] MFC-US  12/30/2018  2:00 PM [place] 3 [facility]MFCUS MFC-US  12/30/2018  3:00 PM [place] NST [place] MFC-US  01/03/2019  1:45 PM [facility] NURSE [facility] MFC-US  01/03/2019  1:45 PM [place] 2 [facility]-MFCUS MFC-US  01/04/2019  2:15 PM Shelly Bombard, MD [facility] None  01/06/2019  1:45 PM [place] NURSE [facility] MFC-US  01/06/2019  1:45 PM [place] 2 WH-MFCUS MFC-US  01/06/2019  3:00 PM [facility] NST [facility] MFC-US    Mora Bellman, MD

## 2018-12-21 NOTE — Progress Notes (Addendum)
ROB, patient ate this AM.  GTT will be rescheduled.  TDAP given in RD, tolerated well. FLU declined.

## 2018-12-22 ENCOUNTER — Other Ambulatory Visit: Payer: BLUE CROSS/BLUE SHIELD

## 2018-12-22 DIAGNOSIS — Z23 Encounter for immunization: Secondary | ICD-10-CM | POA: Diagnosis not present

## 2018-12-22 DIAGNOSIS — Z3480 Encounter for supervision of other normal pregnancy, unspecified trimester: Secondary | ICD-10-CM

## 2018-12-22 NOTE — Addendum Note (Signed)
Addended by: Tamela Oddi on: 12/22/2018 08:25 AM   Modules accepted: Orders

## 2018-12-22 NOTE — Progress Notes (Unsigned)
Note for patient

## 2018-12-23 ENCOUNTER — Other Ambulatory Visit: Payer: Self-pay

## 2018-12-23 ENCOUNTER — Ambulatory Visit (HOSPITAL_COMMUNITY): Payer: BLUE CROSS/BLUE SHIELD | Admitting: *Deleted

## 2018-12-23 ENCOUNTER — Encounter (HOSPITAL_COMMUNITY): Payer: Self-pay

## 2018-12-23 ENCOUNTER — Ambulatory Visit (HOSPITAL_COMMUNITY)
Admission: RE | Admit: 2018-12-23 | Discharge: 2018-12-23 | Disposition: A | Discharge status: Home / Self Care | Payer: BLUE CROSS/BLUE SHIELD | Source: Ambulatory Visit | Attending: Obstetrics | Admitting: Obstetrics

## 2018-12-23 VITALS — BP 108/70 | HR 89 | Temp 98.7°F

## 2018-12-23 DIAGNOSIS — O09522 Supervision of elderly multigravida, second trimester: Secondary | ICD-10-CM | POA: Diagnosis not present

## 2018-12-23 DIAGNOSIS — O365990 Maternal care for other known or suspected poor fetal growth, unspecified trimester, not applicable or unspecified: Secondary | ICD-10-CM | POA: Diagnosis present

## 2018-12-23 DIAGNOSIS — O409XX0 Polyhydramnios, unspecified trimester, not applicable or unspecified: Secondary | ICD-10-CM

## 2018-12-23 DIAGNOSIS — Z3A27 27 weeks gestation of pregnancy: Secondary | ICD-10-CM | POA: Diagnosis not present

## 2018-12-23 DIAGNOSIS — O365920 Maternal care for other known or suspected poor fetal growth, second trimester, not applicable or unspecified: Secondary | ICD-10-CM | POA: Insufficient documentation

## 2018-12-23 DIAGNOSIS — Z8759 Personal history of other complications of pregnancy, childbirth and the puerperium: Secondary | ICD-10-CM | POA: Diagnosis present

## 2018-12-23 DIAGNOSIS — O289 Unspecified abnormal findings on antenatal screening of mother: Secondary | ICD-10-CM | POA: Diagnosis not present

## 2018-12-23 LAB — GLUCOSE TOLERANCE, 2 HOURS W/ 1HR
Glucose, 1 hour: 128 mg/dL (ref 65–179)
Glucose, 2 hour: 107 mg/dL (ref 65–152)
Glucose, Fasting: 75 mg/dL (ref 65–91)

## 2018-12-23 LAB — CBC
Hematocrit: 37.1 % (ref 34.0–46.6)
Hemoglobin: 12.2 g/dL (ref 11.1–15.9)
MCH: 30.6 pg (ref 26.6–33.0)
MCHC: 32.9 g/dL (ref 31.5–35.7)
MCV: 93 fL (ref 79–97)
Platelets: 221 10*3/uL (ref 150–450)
RBC: 3.99 x10E6/uL (ref 3.77–5.28)
RDW: 12.5 % (ref 11.7–15.4)
WBC: 15.9 10*3/uL — ABNORMAL HIGH (ref 3.4–10.8)

## 2018-12-23 LAB — HIV ANTIBODY (ROUTINE TESTING W REFLEX): HIV Screen 4th Generation wRfx: NONREACTIVE

## 2018-12-23 LAB — RPR: RPR Ser Ql: NONREACTIVE

## 2018-12-23 NOTE — Procedures (Signed)
Linda Moyer 1980/05/23 [redacted]w[redacted]d  Fetus A Non-Stress Test Interpretation for 12/23/18  Indication: IUGR  Fetal Heart Rate A Mode: External Baseline Rate (A): 145 bpm Variability: Moderate Accelerations: 10 x 10 Decelerations: None Multiple birth?: No  Uterine Activity Mode: Toco Contraction Frequency (min): none noted  Interpretation (Fetal Testing) Nonstress Test Interpretation: Reactive Comments: FHR tracing rev'd by Dr. Donalee Citrin

## 2018-12-27 ENCOUNTER — Ambulatory Visit (HOSPITAL_COMMUNITY): Payer: BLUE CROSS/BLUE SHIELD

## 2018-12-27 ENCOUNTER — Encounter (HOSPITAL_COMMUNITY): Payer: Self-pay | Admitting: *Deleted

## 2018-12-27 ENCOUNTER — Other Ambulatory Visit: Payer: Self-pay

## 2018-12-27 ENCOUNTER — Ambulatory Visit (HOSPITAL_COMMUNITY)
Admission: RE | Admit: 2018-12-27 | Discharge: 2018-12-27 | Disposition: A | Discharge status: Home / Self Care | Payer: BLUE CROSS/BLUE SHIELD | Source: Ambulatory Visit | Attending: Obstetrics | Admitting: Obstetrics

## 2018-12-27 ENCOUNTER — Ambulatory Visit (HOSPITAL_COMMUNITY): Payer: BLUE CROSS/BLUE SHIELD | Admitting: *Deleted

## 2018-12-27 ENCOUNTER — Encounter (HOSPITAL_COMMUNITY): Payer: Self-pay

## 2018-12-27 DIAGNOSIS — O289 Unspecified abnormal findings on antenatal screening of mother: Secondary | ICD-10-CM | POA: Diagnosis not present

## 2018-12-27 DIAGNOSIS — O365990 Maternal care for other known or suspected poor fetal growth, unspecified trimester, not applicable or unspecified: Secondary | ICD-10-CM | POA: Insufficient documentation

## 2018-12-27 DIAGNOSIS — O409XX0 Polyhydramnios, unspecified trimester, not applicable or unspecified: Secondary | ICD-10-CM | POA: Insufficient documentation

## 2018-12-27 DIAGNOSIS — Z3A27 27 weeks gestation of pregnancy: Secondary | ICD-10-CM

## 2018-12-27 DIAGNOSIS — O365920 Maternal care for other known or suspected poor fetal growth, second trimester, not applicable or unspecified: Secondary | ICD-10-CM

## 2018-12-27 DIAGNOSIS — O09522 Supervision of elderly multigravida, second trimester: Secondary | ICD-10-CM | POA: Diagnosis not present

## 2018-12-30 ENCOUNTER — Encounter (HOSPITAL_COMMUNITY): Payer: Self-pay

## 2018-12-30 ENCOUNTER — Ambulatory Visit (HOSPITAL_COMMUNITY): Payer: BLUE CROSS/BLUE SHIELD | Admitting: *Deleted

## 2018-12-30 ENCOUNTER — Other Ambulatory Visit: Payer: Self-pay

## 2018-12-30 ENCOUNTER — Ambulatory Visit (HOSPITAL_COMMUNITY)
Admission: RE | Admit: 2018-12-30 | Discharge: 2018-12-30 | Disposition: A | Discharge status: Home / Self Care | Payer: BLUE CROSS/BLUE SHIELD | Source: Ambulatory Visit | Attending: Obstetrics | Admitting: Obstetrics

## 2018-12-30 DIAGNOSIS — Z3A28 28 weeks gestation of pregnancy: Secondary | ICD-10-CM | POA: Diagnosis not present

## 2018-12-30 DIAGNOSIS — O365920 Maternal care for other known or suspected poor fetal growth, second trimester, not applicable or unspecified: Secondary | ICD-10-CM

## 2018-12-30 DIAGNOSIS — O409XX0 Polyhydramnios, unspecified trimester, not applicable or unspecified: Secondary | ICD-10-CM | POA: Insufficient documentation

## 2018-12-30 DIAGNOSIS — O365930 Maternal care for other known or suspected poor fetal growth, third trimester, not applicable or unspecified: Secondary | ICD-10-CM

## 2018-12-30 DIAGNOSIS — O09522 Supervision of elderly multigravida, second trimester: Secondary | ICD-10-CM

## 2018-12-30 DIAGNOSIS — O365990 Maternal care for other known or suspected poor fetal growth, unspecified trimester, not applicable or unspecified: Secondary | ICD-10-CM | POA: Insufficient documentation

## 2018-12-30 DIAGNOSIS — O289 Unspecified abnormal findings on antenatal screening of mother: Secondary | ICD-10-CM | POA: Diagnosis not present

## 2018-12-30 NOTE — Procedures (Signed)
Linda Moyer Apr 30, 1980 [redacted]w[redacted]d  Fetus A Non-Stress Test Interpretation for 12/30/18  Indication: IUGR  Fetal Heart Rate A Mode: External Baseline Rate (A): 140 bpm Variability: Moderate Accelerations: 10 x 10 Decelerations: None Multiple birth?: No  Uterine Activity Mode: Toco Contraction Frequency (min): none noted  Interpretation (Fetal Testing) Nonstress Test Interpretation: Reactive Comments: FHR tracing rev'd by Dr. Annamaria Boots

## 2019-01-03 ENCOUNTER — Other Ambulatory Visit: Payer: Self-pay

## 2019-01-03 ENCOUNTER — Encounter (HOSPITAL_COMMUNITY): Payer: Self-pay | Admitting: *Deleted

## 2019-01-03 ENCOUNTER — Ambulatory Visit (HOSPITAL_COMMUNITY): Payer: BLUE CROSS/BLUE SHIELD

## 2019-01-03 ENCOUNTER — Ambulatory Visit (HOSPITAL_COMMUNITY)
Admission: RE | Admit: 2019-01-03 | Discharge: 2019-01-03 | Disposition: A | Discharge status: Home / Self Care | Payer: BLUE CROSS/BLUE SHIELD | Source: Ambulatory Visit | Attending: Obstetrics | Admitting: Obstetrics

## 2019-01-03 ENCOUNTER — Ambulatory Visit (HOSPITAL_COMMUNITY): Payer: BLUE CROSS/BLUE SHIELD | Admitting: *Deleted

## 2019-01-03 DIAGNOSIS — O409XX0 Polyhydramnios, unspecified trimester, not applicable or unspecified: Secondary | ICD-10-CM | POA: Diagnosis present

## 2019-01-03 DIAGNOSIS — O09523 Supervision of elderly multigravida, third trimester: Secondary | ICD-10-CM

## 2019-01-03 DIAGNOSIS — O289 Unspecified abnormal findings on antenatal screening of mother: Secondary | ICD-10-CM

## 2019-01-03 DIAGNOSIS — O365990 Maternal care for other known or suspected poor fetal growth, unspecified trimester, not applicable or unspecified: Secondary | ICD-10-CM | POA: Diagnosis present

## 2019-01-03 DIAGNOSIS — Z3A28 28 weeks gestation of pregnancy: Secondary | ICD-10-CM | POA: Diagnosis not present

## 2019-01-04 ENCOUNTER — Ambulatory Visit (INDEPENDENT_AMBULATORY_CARE_PROVIDER_SITE_OTHER): Payer: BLUE CROSS/BLUE SHIELD | Admitting: Obstetrics

## 2019-01-04 ENCOUNTER — Encounter: Payer: Self-pay | Admitting: Obstetrics

## 2019-01-04 DIAGNOSIS — O09523 Supervision of elderly multigravida, third trimester: Secondary | ICD-10-CM

## 2019-01-04 DIAGNOSIS — O365931 Maternal care for other known or suspected poor fetal growth, third trimester, fetus 1: Secondary | ICD-10-CM

## 2019-01-04 DIAGNOSIS — Z3A29 29 weeks gestation of pregnancy: Secondary | ICD-10-CM

## 2019-01-04 NOTE — Progress Notes (Signed)
Pt had u/s yesterday, recommend delivery at 34 delivery. Pt has concerns about scans and delivering early.

## 2019-01-04 NOTE — Progress Notes (Signed)
TELEHEALTH OBSTETRICS PRENATAL VIRTUAL VIDEO VISIT ENCOUNTER NOTE  Provider location: [facility] [place]   I connected with Linda Moyer on 01/04/19 at  2:15 PM EDT by WebEx OB  Video Encounter at home and verified that I am speaking with the correct person using two identifiers.   I discussed the limitations, risks, security and privacy concerns of performing an evaluation and management service virtually and the availability of in person appointments. I also discussed with the patient that there may be a patient responsible charge related to this service. The patient expressed understanding and agreed to proceed. Subjective:  Linda Moyer is a 38 y.o. G3P0020 at [redacted]w[redacted]d being seen today for ongoing prenatal care.  She is currently monitored for the following issues for this high-risk pregnancy and has Encounter for supervision of normal pregnancy, antepartum; AMA (advanced maternal age) multigravida 35+; and Polyhydramnios affecting pregnancy on their problem list.  Patient reports no complaints.  Contractions: Not present. Vag. Bleeding: None.  Movement: Present. Denies any leaking of fluid.   The following portions of the patient's history were reviewed and updated as appropriate: allergies, current medications, past family history, past medical history, past social history, past surgical history and problem list.   Objective:  There were no vitals filed for this visit.  Fetal Status:     Movement: Present     General:  Alert, oriented and cooperative. Patient is in no acute distress.  Respiratory: Normal respiratory effort, no problems with respiration noted  Mental Status: Normal mood and affect. Normal behavior. Normal judgment and thought content.  Rest of physical exam deferred due to type of encounter  Imaging: [place] Mfm Fetal Bpp Wo Non Stress  Result Date: 01/03/2019 ----------------------------------------------------------------------  OBSTETRICS  REPORT                       (Signed Final 01/03/2019 05:00 pm) ---------------------------------------------------------------------- Patient Info  ID #:       [id number]                          D.O.B.:  [birth year] (38 yrs)  Name:       Linda Moyer                  Visit Date: 01/03/2019 02:51 pm ---------------------------------------------------------------------- Performed By  Performed By:     Percell Boston          Ref. Address:     [address]                                                             [address] [phone]  [place]                                                             [address]  Attending:        Lin Landsman      [facility]                    MD                                       [facility]  Referred By:      [facility] ---------------------------------------------------------------------- Orders   #  Description                          Code         Ordered By   1  [place] MFM FETAL BPP WO NON              76819.01     YU FANG      STRESS   2  [place] MFM UA CORD DOPPLER               76820.02     YU FANG  ----------------------------------------------------------------------   #  Order #                    Accession #                 Episode #   1  [id number]                  [id number]                  [id number]   2  [id number]                  [id number]                  [id number]  ---------------------------------------------------------------------- Indications   [redacted] weeks gestation of pregnancy                Z3A.28  ---------------------------------------------------------------------- Vital Signs                                                 Height:        5'6" ---------------------------------------------------------------------- Fetal Evaluation  Num Of Fetuses:         1  Fetal Heart Rate(bpm):  146  Cardiac  Activity:       Observed  Presentation:           Cephalic  Amniotic Fluid  AFI FV:      Within normal limits  AFI Sum(cm)     %Tile       Largest Pocket(cm)  13.23           39          4.65  RUQ(cm)       RLQ(cm)  LUQ(cm)        LLQ(cm)  2.98          4.65          1.72           3.88 ---------------------------------------------------------------------- Biophysical Evaluation  Amniotic F.V:   Within normal limits       F. Tone:        Observed  F. Movement:    Observed                   Score:          8/8  F. Breathing:   Observed ---------------------------------------------------------------------- OB History  Gravidity:    3         Term:   0        Prem:   0        SAB:   0  TOP:          1       Ectopic:  1        Living: 0 ---------------------------------------------------------------------- Gestational Age  LMP:           28w 6d        Date:  06/15/18                 EDD:   03/22/19  Best:          Eden Emms 6d     Det. By:  LMP  (06/15/18)          EDD:   03/22/19 ---------------------------------------------------------------------- Anatomy  Thoracic:              Appears normal         Bladder:                Appears normal  Stomach:               Appears normal, left                         sided ---------------------------------------------------------------------- Doppler - Fetal Vessels  Umbilical Artery                                                            ADFV    RDFV                                                                Y       N ---------------------------------------------------------------------- Impression  Known IUGR with abnormal UA Dopplers  Intermittent absent EDF  Biophysical profile 8/8 ---------------------------------------------------------------------- Recommendations  Continue 2x weekly UA Dopplers with BPP and alternating  NST on Fridays  Scheduled for growth next tuesday  Consider delivery by 34 weeks given AEDF.  ----------------------------------------------------------------------               Lin Landsman, MD Electronically Signed Final Report   01/03/2019 05:00 pm ----------------------------------------------------------------------  [place] Mfm Ob Follow Up  Result Date: 12/16/2018 ----------------------------------------------------------------------  OBSTETRICS REPORT                       (  Signed Final 12/16/2018 04:30 pm) ---------------------------------------------------------------------- Patient Info  ID #:       [id number]                          D.O.B.:  1981/02/04 (38 yrs)  Name:       Linda Moyer                  Visit Date: 12/16/2018 03:12 pm ---------------------------------------------------------------------- Performed By  Performed By:     Eden Lathe BS      Ref. Address:     [address]                    RDMS RVT                                                             [address]                                                             [address]                                                             [place]                                                             [address]  Attending:        Ma Rings MD         [facility]                                                             [facility]  Referred By:      [facility] ---------------------------------------------------------------------- Orders   #  Description                          Code         Ordered By   1  [place] MFM UA CORD DOPPLER               76820.02     RAVI SHANKAR   2  [place] MFM OB FOLLOW UP                  19147.82     RAVI SHANKAR  ----------------------------------------------------------------------   #  Order #  Accession #                 Episode #   1  [id number]                  [id number]                  [id number]   2  [id number]                  [id number]                  [id number]  ----------------------------------------------------------------------  Indications   Maternal care for known or suspected poor      O36.5920   fetal growth, second trimester, not applicable   or unspecified IUGR   Small for gestational age fetus affecting      O60.5990   management of mother   Advanced maternal age multigravida 50+,        O71.522   second trimester (low risk NIPS)   Abnormal finding on antenatal screening        O28.9   (abnormal UAD)   [redacted] weeks gestation of pregnancy                Z3A.26  ---------------------------------------------------------------------- Vital Signs                                                 Height:        5'6" ---------------------------------------------------------------------- Fetal Evaluation  Num Of Fetuses:         1  Fetal Heart Rate(bpm):  146  Cardiac Activity:       Observed  Presentation:           Cephalic  Placenta:               Anterior  P. Cord Insertion:      Visualized  Amniotic Fluid  AFI FV:      Within normal limits                              Largest Pocket(cm)                              6.75 ---------------------------------------------------------------------- Biometry  BPD:      66.3  mm     G. Age:  26w 5d         55  %    CI:        76.42   %    70 - 86                                                          FL/HC:      16.9   %    18.6 - 20.4  HC:      240.3  mm     G. Age:  26w 1d         19  %    HC/AC:      1.28        1.04 -  1.22  AC:      187.2  mm     G. Age:  23w 3d        < 1  %    FL/BPD:     61.2   %    71 - 87  FL:       40.6  mm     G. Age:  23w 1d        < 1  %    FL/AC:      21.7   %    20 - 24  HUM:      37.9  mm     G. Age:  23w 2d        < 5  %  Est. FW:     625  gm      1 lb 6 oz    < 1  % ---------------------------------------------------------------------- OB History  Gravidity:    3         Term:   0        Prem:   0        SAB:   0  TOP:          1       Ectopic:  1        Living: 0 ---------------------------------------------------------------------- Gestational Age  LMP:            26w 2d        Date:  06/15/18                 EDD:   03/22/19  U/S Today:     24w 6d                                        EDD:   04/01/19  Best:          26w 2d     Det. By:  LMP  (06/15/18)          EDD:   03/22/19 ---------------------------------------------------------------------- Anatomy  Cranium:               Appears normal         Aortic Arch:            Appears normal  Cavum:                 Appears normal         Ductal Arch:            Not well visualized  Ventricles:            Appears normal         Diaphragm:              Appears normal  Choroid Plexus:        Appears normal         Stomach:                Appears normal, left  sided  Cerebellum:            Appears normal         Abdomen:                Appears normal  Posterior Fossa:       Appears normal         Abdominal Wall:         Appears nml (cord                                                                        insert, abd wall)  Nuchal Fold:           Previously seen        Cord Vessels:           Appears normal (3                                                                        vessel cord)  Face:                  Appears normal         Kidneys:                Appear normal                         (orbits and profile)  Moyer:                  Appears normal         Bladder:                Appears normal  Palate:                Not well visualized    Spine:                  Appears normal  Thoracic:              Appears normal         Upper Extremities:      Appears normal  RVOT:                  Appears normal         Lower Extremities:      Appears normal  LVOT:                  Appears normal  Other:  Fetus appears to be a female. Heels and 5th digit visualized. Nasal          bone visualized. ---------------------------------------------------------------------- Doppler - Fetal Vessels  Umbilical Artery  ADFV    RDFV                                                              Yes      No ---------------------------------------------------------------------- Cervix Uterus Adnexa  Cervix  Not visualized (advanced GA >24wks)  Uterus  No abnormality visualized.  Left Ovary  Within normal limits.  Right Ovary  Within normal limits.  Cul De Sac  No free fluid seen.  Adnexa  No abnormality visualized. ---------------------------------------------------------------------- Comments  This patient has been followed due to an IUGR fetus.  She  reports feeling vigorous fetal movements throughout the day.  She received a complete course of antenatal corticosteroids  earlier this week.  On today's ultrasound exam, the overall EFW continues to  measure at less than the 10th percentile for her gestational  age, indicating IUGR.  The fetus has grown about 150 g over  the past 2 weeks.  Doppler studies of the umbilical arteries performed due to  IUGR continues to show intermittent absent end-diastolic  flow.  The majority of the Doppler studies continue to show an  elevated S/D ratio of between 5-6.  Vigorous fetal movements were noted throughout today's  ultrasound exam.  There was normal amniotic fluid noted.  The patient was advised that due to IUGR, we will continue to  follow her closely with twice weekly fetal testing and Doppler  studies.  She was advised that hopefully with continued close  maternal and fetal surveillance, we can help her reach a  more optimal gestational age for delivery.  Fetal kick count  instructions were discussed today. ----------------------------------------------------------------------                   Ma Rings, MD Electronically Signed Final Report   12/16/2018 04:30 pm ----------------------------------------------------------------------  [place] Mfm Ob Limited  Result Date: 12/14/2018 ----------------------------------------------------------------------  OBSTETRICS REPORT                         (Signed Final 12/14/2018 11:07 am) ---------------------------------------------------------------------- Patient Info  ID #:       [id number]                          D.O.B.:  December 09, 1980 (38 yrs)  Name:       Linda Moyer                  Visit Date: 12/13/2018 03:42 pm ---------------------------------------------------------------------- Performed By  Performed By:     Percell Boston          Ref. Address:      [address]  [address]                                                              [place]                                                              [address]  Attending:        Lin Landsman      [facility]                    MD                                        [facility]etal [facility]  Referred By:      [facility] ---------------------------------------------------------------------- Orders   #  Description                          Code         Ordered By   1  [place] MFM UA CORD DOPPLER               76820.02     Lin Landsman   2  [place] MFM OB LIMITED                    [id number]     Lin Landsman  ----------------------------------------------------------------------   #  Order #                    Accession #                 Episode #   1  [id number]                  [id number]   2  [id number]                  [id number]                  [id number]  ---------------------------------------------------------------------- Indications   [redacted] weeks gestation of pregnancy                Z3A.25  Small for gestational age fetus affecting      O25.5990   management of mother   Advanced maternal age multigravida 36+,        O64.522   second trimester (low risk NIPS)   Polyhydramnios, second  trimester               O40.2XX1  ---------------------------------------------------------------------- Vital Signs                                                 Height:        5'6" ---------------------------------------------------------------------- Fetal Evaluation  Num Of Fetuses:          1  Fetal Heart Rate(bpm):   137  Cardiac Activity:        Observed  Presentation:            Cephalic  Amniotic Fluid  AFI FV:      Within normal limits                              Largest Pocket(cm)                              7.08 ---------------------------------------------------------------------- OB History  Gravidity:    3         Term:   0        Prem:   0        SAB:   0  TOP:          1       Ectopic:  1        Living: 0 ---------------------------------------------------------------------- Gestational Age  LMP:           25w 6d        Date:  06/15/18                 EDD:   03/22/19  Best:          Alcus Dad 6d     Det. By:  LMP  (06/15/18)          EDD:   03/22/19 ---------------------------------------------------------------------- Anatomy  Thoracic:              Appears normal         Bladder:                Appears normal  Stomach:               Appears normal, left                         sided ---------------------------------------------------------------------- Doppler - Fetal Vessels  Umbilical Artery                                                            ADFV    RDFV  Yes      No ---------------------------------------------------------------------- Cervix Uterus Adnexa  Cervix  Not visualized (advanced GA >24wks) ---------------------------------------------------------------------- Impression  Known IUGR  Absent EDF some intermittent ADEF  Second betamethasone given today.  We discussed today's findings of continued intermittement  AEDF and persistent ADEF. She feels fetal movement and  there is good amniotic fluid. We discussed increased  risk for  preterm delivery and inpatient management. For now we plan  for twice weekly UA Dopplers (repeat on Friday). ---------------------------------------------------------------------- Recommendations  Follow up UA Dopplers on friday.  Delivery by 34 weeks if AEDF persist.  Initiate NST next week. ----------------------------------------------------------------------               Lin Landsman, MD Electronically Signed Final Report   12/14/2018 11:07 am ----------------------------------------------------------------------  [place] Mfm Ob Limited  Result Date: 12/09/2018 ----------------------------------------------------------------------  OBSTETRICS REPORT                    (Corrected Final 12/09/2018 04:20 pm) ---------------------------------------------------------------------- Patient Info  ID #:       [id number]                          D.O.B.:  February 08, 1981 (38 yrs)  Name:       Linda Moyer                  Visit Date: 12/09/2018 02:39 pm ---------------------------------------------------------------------- Performed By  Performed By:     Hurman Horn          Ref. Address:     [address]                                                             [address]                                                             [place]                                                             [address]  Attending:        Lin Landsman      [facility]                    MD  Fetal Care  Referred By:      [facility] ---------------------------------------------------------------------- Orders   #  Description                          Code         Ordered By   1  [place] MFM OB LIMITED                    76815.01     RAVI SHANKAR   2  [place] MFM UA CORD DOPPLER               76820.02     [facility]   ----------------------------------------------------------------------   #  Order #                    Accession #                 Episode #   1  [id number]                  [id number]   2  [id number]                  [id number]  ---------------------------------------------------------------------- Indications   Small for gestational age fetus affecting      O36.5990   management of mother   Advanced maternal age multigravida 55+,        O20.522   second trimester (low risk NIPS)   Polyhydramnios, second trimester               O40.2XX1   [redacted] weeks gestation of pregnancy                Z3A.25  ---------------------------------------------------------------------- Vital Signs                                                 Height:        5'6" ---------------------------------------------------------------------- Fetal Evaluation  Num Of Fetuses:         1  Fetal Heart Rate(bpm):  145  Cardiac Activity:       Observed  Presentation:           Cephalic  Placenta:               Anterior  P. Cord Insertion:      Visualized, central  Amniotic Fluid  AFI FV:      Within normal limits                              Largest Pocket(cm)                              7.68  Comment:    Bladder, stomach, diaphragm seen. ---------------------------------------------------------------------- OB History  Gravidity:    3         Term:   0        Prem:   0        SAB:   0  TOP:  1       Ectopic:  1        Living: 0 ---------------------------------------------------------------------- Gestational Age  LMP:           25w 2d        Date:  06/15/18                 EDD:   03/22/19  Best:          Alcus Dad 2d     Det. By:  LMP  (06/15/18)          EDD:   03/22/19 ---------------------------------------------------------------------- Doppler - Fetal Vessels  Umbilical Artery   S/D     %tile     RI                                     ADFV    RDFV  6.72    > 97.5  0.85                                         Yes      No ---------------------------------------------------------------------- Cervix Uterus Adnexa  Cervix  Length:           3.34  cm.  Normal appearance by transabdominal scan.  Uterus  No abnormality visualized.  Left Ovary  Within normal limits.  Right Ovary  Within normal limits.  Adnexa  No abnormality visualized. ---------------------------------------------------------------------- Impression  Known IUGR  Declined amniocentesis  Today UA Dopplers demonstrating persistent absent diastolic  flow. There was good fetal movement and amniotic fluid.  I explained that this finding suggest worsening placental  function. We discussed an increased risk for preterm delivery  and inpatient admission.  I recommend increasing testing to 2x weekly. Betamethasone  next week. Monday or Tuesday. ---------------------------------------------------------------------- Recommendations  Follow up UA dopplers next week tuesday.  Initiate 2x weekly testing  Betamethasone next week Monday and Tuesday  Repeat growth in 2 week ----------------------------------------------------------------------                    Lin Landsman, MD Electronically Signed Corrected Final Report  12/09/2018 04:20 pm ----------------------------------------------------------------------  [place] Mfm Ua Cord Doppler  Result Date: 01/03/2019 ----------------------------------------------------------------------  OBSTETRICS REPORT                       (Signed Final 01/03/2019 05:00 pm) ---------------------------------------------------------------------- Patient Info  ID #:       [id number]                          D.O.B.:  Jul 16, 1980 (38 yrs)  Name:       Linda Moyer                  Visit Date: 01/03/2019 02:51 pm ---------------------------------------------------------------------- Performed By  Performed By:     Percell Boston          Ref. Address:     [address]                    RDMS  [address]                                                             [address]                                                             [place]                                                             [address]  Attending:        Lin Landsman      [facility]                    MD                                       Fetal Care  Referred By:      [facility] ---------------------------------------------------------------------- Orders   #  Description                          Code         Ordered By   1  [place] MFM FETAL BPP WO NON              76819.01     YU FANG      STRESS   2  [place] MFM UA CORD DOPPLER               76820.02     YU FANG  ----------------------------------------------------------------------   #  Order #                    Accession #                 Episode #   1  [id number]                  [id number]                  [id number]   2  [id number]                  [id number]                  [id number]  ---------------------------------------------------------------------- Indications   [redacted] weeks gestation of pregnancy                Z3A.28  ---------------------------------------------------------------------- Vital Signs  Height:        5'6" ---------------------------------------------------------------------- Fetal Evaluation  Num Of Fetuses:         1  Fetal Heart Rate(bpm):  146  Cardiac Activity:       Observed  Presentation:           Cephalic  Amniotic Fluid  AFI FV:      Within normal limits  AFI Sum(cm)     %Tile       Largest Pocket(cm)  13.23           39          4.65  RUQ(cm)       RLQ(cm)       LUQ(cm)        LLQ(cm)  2.98          4.65          1.72           3.88 ---------------------------------------------------------------------- Biophysical Evaluation  Amniotic F.V:   Within normal limits       F. Tone:        Observed  F. Movement:    Observed                   Score:          8/8   F. Breathing:   Observed ---------------------------------------------------------------------- OB History  Gravidity:    3         Term:   0        Prem:   0        SAB:   0  TOP:          1       Ectopic:  1        Living: 0 ---------------------------------------------------------------------- Gestational Age  LMP:           28w 6d        Date:  06/15/18                 EDD:   03/22/19  Best:          Eden Emms 6d     Det. By:  LMP  (06/15/18)          EDD:   03/22/19 ---------------------------------------------------------------------- Anatomy  Thoracic:              Appears normal         Bladder:                Appears normal  Stomach:               Appears normal, left                         sided ---------------------------------------------------------------------- Doppler - Fetal Vessels  Umbilical Artery                                                            ADFV    RDFV  Y       N ---------------------------------------------------------------------- Impression  Known IUGR with abnormal UA Dopplers  Intermittent absent EDF  Biophysical profile 8/8 ---------------------------------------------------------------------- Recommendations  Continue 2x weekly UA Dopplers with BPP and alternating  NST on Fridays  Scheduled for growth next tuesday  Consider delivery by 34 weeks given AEDF. ----------------------------------------------------------------------               Lin Landsman, MD Electronically Signed Final Report   01/03/2019 05:00 pm ----------------------------------------------------------------------  [place] Mfm Ua Cord Doppler  Result Date: 12/30/2018 ----------------------------------------------------------------------  OBSTETRICS REPORT                       (Signed Final 12/30/2018 04:04 pm) ---------------------------------------------------------------------- Patient Info  ID #:       [id number]                           D.O.B.:  1980-05-07 (38 yrs)  Name:       Linda Mote Bartolotta                  Visit Date: 12/30/2018 02:20 pm ---------------------------------------------------------------------- Performed By  Performed By:     Lenise Arena        Ref. Address:     [address]                                                             [address]                                                             [place]                                                             [address]  Attending:        Ma Rings MD         [facility]  Referred By:      [facility] ----------------------------------------------------------------------  Orders   #  Description                          Code         Ordered By   1  [place] MFM UA CORD DOPPLER               [phone]     YU FANG  ----------------------------------------------------------------------   #  Order #                    Accession #                 Episode #   1  [id number]                  [id number]                  [id number]  ---------------------------------------------------------------------- Indications   Maternal care for known or suspected poor      O36.5920   fetal growth, second trimester, not applicable   or unspecified IUGR   Small for gestational age fetus affecting      O21.5990   management of mother   Advanced maternal age multigravida 79+,        O75.522   second trimester (low risk NIPS)   Abnormal finding on antenatal screening        O28.9   (abnormal UAD)   [redacted] weeks gestation of pregnancy                Z3A.28  ---------------------------------------------------------------------- Vital Signs  Weight (lb): 153                               Height:        5'6"  BMI:         24.69 ---------------------------------------------------------------------- Fetal Evaluation  Num Of  Fetuses:         1  Fetal Heart Rate(bpm):  142  Cardiac Activity:       Observed  Presentation:           Breech  Placenta:               Anterior  P. Cord Insertion:      Previously Visualized  Amniotic Fluid  AFI FV:      Within normal limits  AFI Sum(cm)     %Tile       Largest Pocket(cm)  17.72           67          5.23  RUQ(cm)       RLQ(cm)       LUQ(cm)        LLQ(cm)  4.65          4.23          3.61           5.23 ---------------------------------------------------------------------- OB History  Gravidity:    3         Term:   0        Prem:   0        SAB:   0  TOP:          1       Ectopic:  1        Living: 0 ---------------------------------------------------------------------- Gestational Age  LMP:  28w 2d        Date:  06/15/18                 EDD:   03/22/19  Best:          Eden Emms 2d     Det. By:  LMP  (06/15/18)          EDD:   03/22/19 ---------------------------------------------------------------------- Anatomy  Thoracic:              Appears normal         Bladder:                Appears normal  Stomach:               Appears normal, left                         sided ---------------------------------------------------------------------- Doppler - Fetal Vessels  Umbilical Artery   S/D     %tile                                            ADFV    RDFV  10.2    > 97.5                                              Yes      No ---------------------------------------------------------------------- Cervix Uterus Adnexa  Cervix  Not visualized (advanced GA >24wks) ---------------------------------------------------------------------- Comments   This patient was seen for an umbilical artery Doppler study  due to an IUGR fetus.  She denies any problems since her  last exam.  She reports feeling vigorous fetal movements  throughout the day.  Doppler studies of the umbilical arteries performed today due  to IUGR showed intermittent absent end-diastolic flow.  The  placenta appears to be very small.   The patient was advised that placental dysfunction is most  likely causing the IUGR and the abnormal umbilical artery  Doppler studies.  The increased risk of early onset severe  preeclampsia due to placental dysfunction was also  discussed.  We will continue to follow her closely with twice weekly fetal  testing and umbilical artery Doppler studies.  The patient  understands that our goal would be to try to get her to as  close to 34 weeks as possible before delivery.  She has  already received a complete course of antenatal  corticosteroids. ----------------------------------------------------------------------                   Ma Rings, MD Electronically Signed Final Report   12/30/2018 04:04 pm ----------------------------------------------------------------------  [place] Mfm Ua Cord Doppler  Result Date: 12/27/2018 ----------------------------------------------------------------------  OBSTETRICS REPORT                       (Signed Final 12/27/2018 03:26 pm) ---------------------------------------------------------------------- Patient Info  ID #:       [id number]                          D.O.B.:  14-May-1980 (38 yrs)  Name:       Linda Moyer  Visit Date: 12/27/2018 01:35 pm ---------------------------------------------------------------------- Performed By  Performed By:     Percell BostonHeather Waken          Ref. Address:     [address]                    [address]                                                             [address]                                                             [address]                                                             [place]                                                             [id number]  Attending:        Ma RingsVictor Fang MD         Location[facility]  Referred By:      [facility] ---------------------------------------------------------------------- Orders   #   Description                          Code         Ordered By   1  US MFM UA CORD DOPPLER               [id number]     YU FANG  ----------------------------------------------------------------------   #  Order #                    Accession #                 Episode #   1  [id number]                  [id number]                  [id number]  ---------------------------------------------------------------------- Indications   Maternal care for known or suspected poor      O36.5920   fetal growth, second trimester, not applicable   or unspecified IUGR  Small for gestational age fetus affecting      O55.5990   management of mother   Advanced maternal age multigravida 42+,        O41.522   second trimester (low risk NIPS)   Abnormal finding on antenatal screening        O28.9   (abnormal UAD)   [redacted] weeks gestation of pregnancy                Z3A.27  ---------------------------------------------------------------------- Vital Signs                                                 Height:        5'6" ---------------------------------------------------------------------- Fetal Evaluation  Num Of Fetuses:         1  Fetal Heart Rate(bpm):  138  Cardiac Activity:       Observed  Presentation:           Cephalic  Amniotic Fluid  AFI FV:      Within normal limits                              Largest Pocket(cm)                              7.49 ---------------------------------------------------------------------- OB History  Gravidity:    3         Term:   0        Prem:   0        SAB:   0  TOP:          1       Ectopic:  1        Living: 0 ---------------------------------------------------------------------- Gestational Age  LMP:           27w 6d        Date:  06/15/18                 EDD:   03/22/19  Best:          27w 6d     Det. By:  LMP  (06/15/18)          EDD:   03/22/19 ---------------------------------------------------------------------- Anatomy  Thoracic:              Appears normal         Bladder:                Appears  normal  Stomach:               Appears normal, left                         sided ---------------------------------------------------------------------- Doppler - Fetal Vessels  Umbilical Artery   S/D     %tile     RI              PI                     ADFV    RDFV  5.61    > 97.5  0.82             1.66  N       N ---------------------------------------------------------------------- Cervix Uterus Adnexa  Cervix  Not visualized (advanced GA >24wks)  Uterus  No abnormality visualized.  Left Ovary  No adnexal mass visualized.  Right Ovary  No adnexal mass visualized.  Cul De Sac  No free fluid seen.  Adnexa  No abnormality visualized. ---------------------------------------------------------------------- Comments  This patient was seen for umbilical artery Dopplers due to an  IUGR fetus.  She denies any problems since her last exam  and reports feeling vigorous fetal movements on a daily basis.  Doppler studies of the umbilical arteries performed today  showed intermittent absent end-diastolic flow.  There was  normal amniotic fluid noted.  Due to IUGR, we will continue intense fetal surveillance with  twice-weekly fetal testing.  The patient understands that an earlier delivery may be  indicated should there be absent fetal growth noted during  her future ultrasound exams or should there be a worsening  progression of her umbilical artery Doppler studies. ----------------------------------------------------------------------                   Ma Rings, MD Electronically Signed Final Report   12/27/2018 03:26 pm ----------------------------------------------------------------------  [place] Mfm Ua Cord Doppler  Result Date: 12/23/2018 ----------------------------------------------------------------------  OBSTETRICS REPORT                       (Signed Final 12/23/2018 03:30 pm) ---------------------------------------------------------------------- Patient Info  ID #:       [id number]                           D.O.B.:  1980-12-24 (38 yrs)  Name:       Linda Moyer                  Visit Date: 12/23/2018 01:55 pm ---------------------------------------------------------------------- Performed By  Performed By:     Percell Boston          Ref. Address:     [address]                                                             [address]                                                             [place]  [address]  Attending:        Noralee Space MD        Location[facility]  Referred By:      [facility] ---------------------------------------------------------------------- Orders   #  Description                          Code         Ordered By   1  [place] MFM UA CORD DOPPLER               76820.02     YU FANG  ----------------------------------------------------------------------   #  Order #                    Accession #                 Episode #   1  [id number]                  [id number]                  [id number]  ---------------------------------------------------------------------- Indications   [redacted] weeks gestation of pregnancy                Z3A.27   Maternal care for known or suspected poor      O36.5920   fetal growth, second trimester, not applicable   or unspecified IUGR   Small for gestational age fetus affecting      O73.5990   management of mother   Advanced maternal age multigravida 15+,        O84.522   second trimester (low risk NIPS)   Abnormal finding on antenatal screening        O28.9   (abnormal UAD)  ---------------------------------------------------------------------- Vital Signs                                                 Height:        5'6" ---------------------------------------------------------------------- Fetal Evaluation  Num Of Fetuses:          1  Fetal Heart Rate(bpm):  147  Cardiac Activity:       Observed  Presentation:           Cephalic  Amniotic Fluid  AFI FV:      Within normal limits                              Largest Pocket(cm)                              4.37 ---------------------------------------------------------------------- OB History  Gravidity:    3         Term:   0        Prem:   0  SAB:   0  TOP:          1       Ectopic:  1        Living: 0 ---------------------------------------------------------------------- Gestational Age  LMP:           27w 2d        Date:  06/15/18                 EDD:   03/22/19  Best:          27w 2d     Det. By:  LMP  (06/15/18)          EDD:   03/22/19 ---------------------------------------------------------------------- Anatomy  Thoracic:              Appears normal         Bladder:                Appears normal  Stomach:               Appears normal, left                         sided ---------------------------------------------------------------------- Doppler - Fetal Vessels  Umbilical Artery                                                            ADFV    RDFV                                                                Y       N ---------------------------------------------------------------------- Impression  Patient with severe fetal growth restriction returns for  umbilical artery Doppler studies and NST.  Ultrasound  performed last week the estimated fetal weight was 625 g  (less than 1st percentile).  On today's ultrasound, amniotic fluid is normal and good fetal  activity seen.  Umbilical artery Doppler showed persistent  absent end-diastolic flow.  NST is reactive for this gestational  age.  I explained the importance of the finding of fetal growth  restriction on ultrasound and the significance of frequent  antenatal testing.  I also informed her antenatal testing do not  always predict fetal compromise.  Absent end-diastolic flow  carries a higher risk for perinatal  mortality morbidity. But,  delivery at this gestational age leads to extreme prematurity  with complications.  Timing of delivery will be based on  antenatal testing and interval fetal growth.  I also informed her that pushed pregnancy is associated with  higher chances of preeclampsia and that fetal growth  restriction may proceed preeclampsia later in pregnancy.  Blood pressure today at our office is 108/70 mm Hg. ---------------------------------------------------------------------- Recommendations  -Appointments were made for twice-weekly Doppler studies  and NST.  -BPP from 30 weeks' gestation. ----------------------------------------------------------------------                  Noralee Space, MD Electronically Signed Final Report   12/23/2018 03:30 pm ----------------------------------------------------------------------  [place] Mfm Ua Cord Doppler  Result Date: 12/16/2018 ----------------------------------------------------------------------  OBSTETRICS  REPORT                       (Signed Final 12/16/2018 04:30 pm) ---------------------------------------------------------------------- Patient Info  ID #:       [id number]                          D.O.B.:  Jun 05, 1980 (38 yrs)  Name:       Linda Moyer                  Visit Date: 12/16/2018 03:12 pm ---------------------------------------------------------------------- Performed By  Performed By:     Eden Lathe BS      Ref. Address:     [address]                    [facility]                                                             [address]                                                             [address]                                                             [place]                                                             [address]  Attending:        Ma Rings MD         [facility]  Referred By:      [facility]  ---------------------------------------------------------------------- Orders   #  Description                          Code         Ordered By   1  [place] MFM UA CORD DOPPLER               76820.02     RAVI SHANKAR   2  [place] MFM OB FOLLOW UP  19147.82     RAVI [facility]  ----------------------------------------------------------------------   #  Order #                    Accession #                 Episode #   1  [id number]                  [id number]   2  [id number]                  [id number]                  [id number]  ---------------------------------------------------------------------- Indications   Maternal care for known or suspected poor      O36.5920   fetal growth, second trimester, not applicable   or unspecified IUGR   Small for gestational age fetus affecting      O70.5990   management of mother   Advanced maternal age multigravida 6+,        O29.522   second trimester (low risk NIPS)   Abnormal finding on antenatal screening        O28.9   (abnormal UAD)   [redacted] weeks gestation of pregnancy                Z3A.26  ---------------------------------------------------------------------- Vital Signs                                                 Height:        5'6" ---------------------------------------------------------------------- Fetal Evaluation  Num Of Fetuses:         1  Fetal Heart Rate(bpm):  146  Cardiac Activity:       Observed  Presentation:           Cephalic  Placenta:               Anterior  P. Cord Insertion:      Visualized  Amniotic Fluid  AFI FV:      Within normal limits                              Largest Pocket(cm)                              6.75 ---------------------------------------------------------------------- Biometry  BPD:      66.3  mm     G. Age:  26w 5d         55  %    CI:        76.42   %    70 - 86                                                          FL/HC:      16.9   %    18.6 - 20.4  HC:      240.3  mm     G. Age:  26w 1d          19  %    HC/AC:      1.28        1.04 - 1.22  AC:      187.2  mm     G. Age:  23w 3d        < 1  %    FL/BPD:     61.2   %    71 - 87  FL:       40.6  mm     G. Age:  23w 1d        < 1  %    FL/AC:      21.7   %    20 - 24  HUM:      37.9  mm     G. Age:  23w 2d        < 5  %  Est. FW:     625  gm      1 lb 6 oz    < 1  % ---------------------------------------------------------------------- OB History  Gravidity:    3         Term:   0        Prem:   0        SAB:   0  TOP:          1       Ectopic:  1        Living: 0 ---------------------------------------------------------------------- Gestational Age  LMP:           26w 2d        Date:  06/15/18                 EDD:   03/22/19  U/S Today:     24w 6d                                        EDD:   04/01/19  Best:          26w 2d     Det. By:  LMP  (06/15/18)          EDD:   03/22/19 ---------------------------------------------------------------------- Anatomy  Cranium:               Appears normal         Aortic Arch:            Appears normal  Cavum:                 Appears normal         Ductal Arch:            Not well visualized  Ventricles:            Appears normal         Diaphragm:              Appears normal  Choroid Plexus:        Appears normal         Stomach:                Appears normal, left  sided  Cerebellum:            Appears normal         Abdomen:                Appears normal  Posterior Fossa:       Appears normal         Abdominal Wall:         Appears nml (cord                                                                        insert, abd wall)  Nuchal Fold:           Previously seen        Cord Vessels:           Appears normal (3                                                                        vessel cord)  Face:                  Appears normal         Kidneys:                Appear normal                         (orbits and profile)  Moyer:                   Appears normal         Bladder:                Appears normal  Palate:                Not well visualized    Spine:                  Appears normal  Thoracic:              Appears normal         Upper Extremities:      Appears normal  RVOT:                  Appears normal         Lower Extremities:      Appears normal  LVOT:                  Appears normal  Other:  Fetus appears to be a female. Heels and 5th digit visualized. Nasal          bone visualized. ---------------------------------------------------------------------- Doppler - Fetal Vessels  Umbilical Artery  ADFV    RDFV                                                              Yes      No ---------------------------------------------------------------------- Cervix Uterus Adnexa  Cervix  Not visualized (advanced GA >24wks)  Uterus  No abnormality visualized.  Left Ovary  Within normal limits.  Right Ovary  Within normal limits.  Cul De Sac  No free fluid seen.  Adnexa  No abnormality visualized. ---------------------------------------------------------------------- Comments  This patient has been followed due to an IUGR fetus.  She  reports feeling vigorous fetal movements throughout the day.  She received a complete course of antenatal corticosteroids  earlier this week.  On today's ultrasound exam, the overall EFW continues to  measure at less than the 10th percentile for her gestational  age, indicating IUGR.  The fetus has grown about 150 g over  the past 2 weeks.  Doppler studies of the umbilical arteries performed due to  IUGR continues to show intermittent absent end-diastolic  flow.  The majority of the Doppler studies continue to show an  elevated S/D ratio of between 5-6.  Vigorous fetal movements were noted throughout today's  ultrasound exam.  There was normal amniotic fluid noted.  The patient was advised that due to IUGR, we will continue to  follow her closely with twice weekly  fetal testing and Doppler  studies.  She was advised that hopefully with continued close  maternal and fetal surveillance, we can help her reach a  more optimal gestational age for delivery.  Fetal kick count  instructions were discussed today. ----------------------------------------------------------------------                   Ma Rings, MD Electronically Signed Final Report   12/16/2018 04:30 pm ----------------------------------------------------------------------  [place] Mfm Ua Cord Doppler  Result Date: 12/14/2018 ----------------------------------------------------------------------  OBSTETRICS REPORT                        (Signed Final 12/14/2018 11:07 am) ---------------------------------------------------------------------- Patient Info  ID #:       [id number]                          D.O.B.:  03-06-1981 (38 yrs)  Name:       Linda Moyer                  Visit Date: 12/13/2018 03:42 pm ---------------------------------------------------------------------- Performed By  Performed By:     Percell Boston          Ref. Address:      [address]  [address]                                                              [place]                                                              [address]  Attending:        Lin Landsman      [facility]                    MD                                        [facility]  Referred By:      [facility] ---------------------------------------------------------------------- Orders   #  Description                          Code         Ordered By   1  [place] MFM UA CORD DOPPLER               76820.02     Lin Landsman   2  [place] MFM OB LIMITED                    [id number]     Lin Landsman  ----------------------------------------------------------------------   #  Order #                    Accession #                 Episode #   1  [id number]                  [id number]   2  [id number]                  [id number]  ---------------------------------------------------------------------- Indications   [redacted] weeks gestation of pregnancy                Z3A.25  Small for gestational age fetus affecting      O79.5990   management of mother   Advanced maternal age multigravida 75+,        O4.522   second trimester (low risk NIPS)   Polyhydramnios, second trimester               O40.2XX1  ---------------------------------------------------------------------- Vital Signs                                                 Height:        5'6" ---------------------------------------------------------------------- Fetal Evaluation  Num Of Fetuses:          1  Fetal Heart Rate(bpm):   137  Cardiac Activity:        Observed  Presentation:            Cephalic  Amniotic Fluid  AFI FV:      Within normal limits                              Largest Pocket(cm)                              7.08 ---------------------------------------------------------------------- OB History  Gravidity:    3         Term:   0        Prem:   0        SAB:   0  TOP:          1       Ectopic:  1        Living: 0 ---------------------------------------------------------------------- Gestational Age  LMP:           25w 6d        Date:  06/15/18                 EDD:   03/22/19  Best:          Alcus Dad 6d     Det. By:  LMP  (06/15/18)          EDD:   03/22/19 ---------------------------------------------------------------------- Anatomy  Thoracic:              Appears normal         Bladder:                Appears normal  Stomach:               Appears normal, left                         sided ---------------------------------------------------------------------- Doppler - Fetal Vessels   Umbilical Artery                                                            ADFV    RDFV  Yes      No ---------------------------------------------------------------------- Cervix Uterus Adnexa  Cervix  Not visualized (advanced GA >24wks) ---------------------------------------------------------------------- Impression  Known IUGR  Absent EDF some intermittent ADEF  Second betamethasone given today.  We discussed today's findings of continued intermittement  AEDF and persistent ADEF. She feels fetal movement and  there is good amniotic fluid. We discussed increased risk for  preterm delivery and inpatient management. For now we plan  for twice weekly UA Dopplers (repeat on Friday). ---------------------------------------------------------------------- Recommendations  Follow up UA Dopplers on friday.  Delivery by 34 weeks if AEDF persist.  Initiate NST next week. ----------------------------------------------------------------------               Lin Landsman, MD Electronically Signed Final Report   12/14/2018 11:07 am ----------------------------------------------------------------------  [place] Mfm Ua Cord Doppler  Result Date: 12/09/2018 ----------------------------------------------------------------------  OBSTETRICS REPORT                    (Corrected Final 12/09/2018 04:20 pm) ---------------------------------------------------------------------- Patient Info  ID #:       [id number]                          D.O.B.:  [birth year] (38 yrs)  Name:       Linda Moyer                  Visit Date: 12/09/2018 02:39 pm ---------------------------------------------------------------------- Performed By  Performed By:     Hurman Horn          Ref. Address:     [address]                                                             [address]                                                              [place]                                                             [address]  Attending:        Lin Landsman      Location[facility]         [facility]                    MD  Fetal Care  Referred By:      [facility] ---------------------------------------------------------------------- Orders   #  Description                          Code         Ordered By   1  [place] MFM OB LIMITED                    76815.01     RAVI SHANKAR   2  [place] MFM UA CORD DOPPLER               76820.02     [facility]  ----------------------------------------------------------------------   #  Order #                    Accession #                 Episode #   1  [id number]                  [id number]   2  [id number]                  [id number]                  [id number]  ---------------------------------------------------------------------- Indications   Small for gestational age fetus affecting      O36.5990   management of mother   Advanced maternal age multigravida 50+,        O41.522   second trimester (low risk NIPS)   Polyhydramnios, second trimester               O40.2XX1   [redacted] weeks gestation of pregnancy                Z3A.25  ---------------------------------------------------------------------- Vital Signs                                                 Height:        5'6" ---------------------------------------------------------------------- Fetal Evaluation  Num Of Fetuses:         1  Fetal Heart Rate(bpm):  145  Cardiac Activity:       Observed  Presentation:           Cephalic  Placenta:               Anterior  P. Cord Insertion:      Visualized, central  Amniotic Fluid  AFI FV:      Within normal limits                              Largest Pocket(cm)                              7.68  Comment:    Bladder, stomach, diaphragm seen. ---------------------------------------------------------------------- OB History  Gravidity:     3         Term:   0        Prem:   0        SAB:   0  TOP:  1       Ectopic:  1        Living: 0 ---------------------------------------------------------------------- Gestational Age  LMP:           25w 2d        Date:  06/15/18                 EDD:   03/22/19  Best:          Melvyn Novas 2d     Det. By:  LMP  (06/15/18)          EDD:   03/22/19 ---------------------------------------------------------------------- Doppler - Fetal Vessels  Umbilical Artery   S/D     %tile     RI                                     ADFV    RDFV  6.72    > 97.5  0.85                                        Yes      No ---------------------------------------------------------------------- Cervix Uterus Adnexa  Cervix  Length:           3.34  cm.  Normal appearance by transabdominal scan.  Uterus  No abnormality visualized.  Left Ovary  Within normal limits.  Right Ovary  Within normal limits.  Adnexa  No abnormality visualized. ---------------------------------------------------------------------- Impression  Known IUGR  Declined amniocentesis  Today UA Dopplers demonstrating persistent absent diastolic  flow. There was good fetal movement and amniotic fluid.  I explained that this finding suggest worsening placental  function. We discussed an increased risk for preterm delivery  and inpatient admission.  I recommend increasing testing to 2x weekly. Betamethasone  next week. Monday or Tuesday. ---------------------------------------------------------------------- Recommendations  Follow up UA dopplers next week tuesday.  Initiate 2x weekly testing  Betamethasone next week Monday and Tuesday  Repeat growth in 2 week ----------------------------------------------------------------------                    Sander Nephew, MD Electronically Signed Corrected Final Report  12/09/2018 04:20 pm ----------------------------------------------------------------------   Assessment and Plan:  Pregnancy: T5T7322 at [redacted]w[redacted]d 1. Supervision of  high risk elderly multigravida in third trimester  2. Intrauterine growth restriction (IUGR) affecting care of mother, third trimester, fetus 1, INTERMITTENT ABSENT END DIASTOLIC FLOW ( AEDF ) - followed twice weekly by MFM with a recommendation to consider delivery at 34 weeks because of the intermittent AEDF on fetal UA Dopplers.   Preterm labor symptoms and general obstetric precautions including but not limited to vaginal bleeding, contractions, leaking of fluid and fetal movement were reviewed in detail with the patient. I discussed the assessment and treatment plan with the patient. The patient was provided an opportunity to ask questions and all were answered. The patient agreed with the plan and demonstrated an understanding of the instructions. The patient was advised to call back or seek an in-person office evaluation/go [facility] for any urgent or concerning symptoms. Please refer to After Visit Summary for other counseling recommendations.   I provided 10 minutes of face-to-face time during this encounter.  Return in about 2 weeks (around 01/18/2019) for WebEx ( Dr. Elly Modena ).  Future Appointments  Date Time Provider Department  Center  01/06/2019  1:45 PM WH-MFC NURSE WH-MFC MFC-US  01/06/2019  1:45 PM [facility]-MFC [place] 2 WH-MFCUS MFC-US  01/06/2019  3:00 PM WH-MFC NST WH-MFC MFC-US  01/10/2019 11:00 AM WH-MFC [place] 1 WH-MFCUS MFC-US  01/13/2019  2:15 PM WH-MFC [place] 2 WH-MFCUS MFC-US  01/13/2019  3:15 PM WH-MFC NST WH-MFC MFC-US  01/17/2019  1:30 PM WH-MFC [place] 1 WH-MFCUS MFC-US  01/20/2019  2:15 PM WH-MFC [place] 4 WH-MFCUS MFC-US  01/20/2019  3:15 PM WH-MFC NST WH-MFC MFC-US    Coral Ceo, MD [facility] 01/04/2019

## 2019-01-05 ENCOUNTER — Other Ambulatory Visit (HOSPITAL_COMMUNITY): Payer: Self-pay | Admitting: *Deleted

## 2019-01-05 DIAGNOSIS — O365930 Maternal care for other known or suspected poor fetal growth, third trimester, not applicable or unspecified: Secondary | ICD-10-CM

## 2019-01-06 ENCOUNTER — Ambulatory Visit (HOSPITAL_COMMUNITY)
Admission: RE | Admit: 2019-01-06 | Discharge: 2019-01-06 | Disposition: A | Discharge status: Home / Self Care | Payer: BLUE CROSS/BLUE SHIELD | Source: Ambulatory Visit | Attending: Obstetrics | Admitting: Obstetrics

## 2019-01-06 ENCOUNTER — Other Ambulatory Visit: Payer: Self-pay

## 2019-01-06 ENCOUNTER — Ambulatory Visit (HOSPITAL_COMMUNITY): Payer: BLUE CROSS/BLUE SHIELD | Admitting: *Deleted

## 2019-01-06 ENCOUNTER — Encounter (HOSPITAL_COMMUNITY): Payer: Self-pay

## 2019-01-06 ENCOUNTER — Other Ambulatory Visit (HOSPITAL_COMMUNITY): Payer: Self-pay | Admitting: Obstetrics

## 2019-01-06 DIAGNOSIS — O09523 Supervision of elderly multigravida, third trimester: Secondary | ICD-10-CM

## 2019-01-06 DIAGNOSIS — O409XX0 Polyhydramnios, unspecified trimester, not applicable or unspecified: Secondary | ICD-10-CM

## 2019-01-06 DIAGNOSIS — Z362 Encounter for other antenatal screening follow-up: Secondary | ICD-10-CM | POA: Diagnosis not present

## 2019-01-06 DIAGNOSIS — O365930 Maternal care for other known or suspected poor fetal growth, third trimester, not applicable or unspecified: Secondary | ICD-10-CM

## 2019-01-06 DIAGNOSIS — O289 Unspecified abnormal findings on antenatal screening of mother: Secondary | ICD-10-CM | POA: Diagnosis not present

## 2019-01-06 DIAGNOSIS — Z3A29 29 weeks gestation of pregnancy: Secondary | ICD-10-CM

## 2019-01-06 DIAGNOSIS — O365990 Maternal care for other known or suspected poor fetal growth, unspecified trimester, not applicable or unspecified: Secondary | ICD-10-CM | POA: Diagnosis present

## 2019-01-06 NOTE — Procedures (Signed)
Linda Moyer December 03, 1980 [redacted]w[redacted]d  Fetus A Non-Stress Test Interpretation for 01/06/19  Indication: IUGR  Fetal Heart Rate A Mode: External Baseline Rate (A): 140 bpm Variability: Moderate Accelerations: 10 x 10 Decelerations: Variable Multiple birth?: No  Uterine Activity Mode: Toco Contraction Frequency (min): occ UC noted Contraction Duration (sec): 80-90 Contraction Quality: Mild Resting Tone Palpated: Relaxed Resting Time: Adequate  Interpretation (Fetal Testing) Nonstress Test Interpretation: Non-reactive Comments: FHR tracing rev'd by Dr. Annamaria Boots, one 10x10 accel noted

## 2019-01-10 ENCOUNTER — Encounter (HOSPITAL_COMMUNITY): Payer: Self-pay

## 2019-01-10 ENCOUNTER — Other Ambulatory Visit: Payer: Self-pay

## 2019-01-10 ENCOUNTER — Ambulatory Visit (HOSPITAL_COMMUNITY): Payer: BLUE CROSS/BLUE SHIELD | Admitting: *Deleted

## 2019-01-10 ENCOUNTER — Ambulatory Visit (HOSPITAL_COMMUNITY)
Admission: RE | Admit: 2019-01-10 | Discharge: 2019-01-10 | Disposition: A | Discharge status: Home / Self Care | Payer: BLUE CROSS/BLUE SHIELD | Source: Ambulatory Visit | Attending: Maternal & Fetal Medicine | Admitting: Maternal & Fetal Medicine

## 2019-01-10 VITALS — BP 110/72 | HR 86 | Temp 98.7°F

## 2019-01-10 DIAGNOSIS — O365931 Maternal care for other known or suspected poor fetal growth, third trimester, fetus 1: Secondary | ICD-10-CM

## 2019-01-10 DIAGNOSIS — O289 Unspecified abnormal findings on antenatal screening of mother: Secondary | ICD-10-CM | POA: Diagnosis not present

## 2019-01-10 DIAGNOSIS — O365930 Maternal care for other known or suspected poor fetal growth, third trimester, not applicable or unspecified: Secondary | ICD-10-CM | POA: Diagnosis present

## 2019-01-10 DIAGNOSIS — O409XX0 Polyhydramnios, unspecified trimester, not applicable or unspecified: Secondary | ICD-10-CM | POA: Diagnosis present

## 2019-01-10 DIAGNOSIS — O09523 Supervision of elderly multigravida, third trimester: Secondary | ICD-10-CM

## 2019-01-10 DIAGNOSIS — Z3A29 29 weeks gestation of pregnancy: Secondary | ICD-10-CM

## 2019-01-13 ENCOUNTER — Ambulatory Visit (HOSPITAL_COMMUNITY): Payer: BLUE CROSS/BLUE SHIELD

## 2019-01-13 ENCOUNTER — Other Ambulatory Visit: Payer: Self-pay

## 2019-01-13 ENCOUNTER — Encounter (HOSPITAL_COMMUNITY): Payer: Self-pay

## 2019-01-13 ENCOUNTER — Ambulatory Visit (HOSPITAL_COMMUNITY): Payer: BLUE CROSS/BLUE SHIELD | Admitting: *Deleted

## 2019-01-13 ENCOUNTER — Ambulatory Visit (HOSPITAL_COMMUNITY)
Admission: RE | Admit: 2019-01-13 | Discharge: 2019-01-13 | Disposition: A | Discharge status: Home / Self Care | Payer: BLUE CROSS/BLUE SHIELD | Source: Ambulatory Visit | Attending: Maternal & Fetal Medicine | Admitting: Maternal & Fetal Medicine

## 2019-01-13 VITALS — BP 114/66 | HR 85 | Temp 98.6°F

## 2019-01-13 DIAGNOSIS — O409XX0 Polyhydramnios, unspecified trimester, not applicable or unspecified: Secondary | ICD-10-CM | POA: Insufficient documentation

## 2019-01-13 DIAGNOSIS — O289 Unspecified abnormal findings on antenatal screening of mother: Secondary | ICD-10-CM

## 2019-01-13 DIAGNOSIS — O09523 Supervision of elderly multigravida, third trimester: Secondary | ICD-10-CM

## 2019-01-13 DIAGNOSIS — O365930 Maternal care for other known or suspected poor fetal growth, third trimester, not applicable or unspecified: Secondary | ICD-10-CM

## 2019-01-13 DIAGNOSIS — Z3A30 30 weeks gestation of pregnancy: Secondary | ICD-10-CM | POA: Diagnosis not present

## 2019-01-13 NOTE — Procedures (Signed)
Linda Moyer Dec 20, 1980 [redacted]w[redacted]d  Fetus A Non-Stress Test Interpretation for 01/13/19  Indication: IUGR  Fetal Heart Rate A Mode: External Baseline Rate (A): 145 bpm Variability: Moderate Accelerations: 10 x 10 Decelerations: None Multiple birth?: No  Uterine Activity Mode: Toco Contraction Frequency (min): occ UC noted Contraction Duration (sec): 60-80 Contraction Quality: Mild Resting Tone Palpated: Relaxed Resting Time: Adequate  Interpretation (Fetal Testing) Nonstress Test Interpretation: Reactive Comments: FHR tracing rev'd by Dr. Annamaria Boots

## 2019-01-17 ENCOUNTER — Ambulatory Visit (HOSPITAL_COMMUNITY): Payer: BLUE CROSS/BLUE SHIELD | Admitting: *Deleted

## 2019-01-17 ENCOUNTER — Encounter (HOSPITAL_COMMUNITY): Payer: Self-pay

## 2019-01-17 ENCOUNTER — Other Ambulatory Visit: Payer: Self-pay

## 2019-01-17 ENCOUNTER — Ambulatory Visit (HOSPITAL_COMMUNITY)
Admission: RE | Admit: 2019-01-17 | Discharge: 2019-01-17 | Disposition: A | Discharge status: Home / Self Care | Payer: BLUE CROSS/BLUE SHIELD | Source: Ambulatory Visit | Attending: Maternal & Fetal Medicine | Admitting: Maternal & Fetal Medicine

## 2019-01-17 DIAGNOSIS — O365930 Maternal care for other known or suspected poor fetal growth, third trimester, not applicable or unspecified: Secondary | ICD-10-CM | POA: Insufficient documentation

## 2019-01-17 DIAGNOSIS — O409XX0 Polyhydramnios, unspecified trimester, not applicable or unspecified: Secondary | ICD-10-CM

## 2019-01-17 DIAGNOSIS — O289 Unspecified abnormal findings on antenatal screening of mother: Secondary | ICD-10-CM | POA: Diagnosis not present

## 2019-01-17 DIAGNOSIS — O09523 Supervision of elderly multigravida, third trimester: Secondary | ICD-10-CM

## 2019-01-17 DIAGNOSIS — Z3A30 30 weeks gestation of pregnancy: Secondary | ICD-10-CM | POA: Diagnosis not present

## 2019-01-19 ENCOUNTER — Encounter: Payer: Self-pay | Admitting: Obstetrics and Gynecology

## 2019-01-19 ENCOUNTER — Other Ambulatory Visit: Payer: Self-pay

## 2019-01-19 ENCOUNTER — Ambulatory Visit (INDEPENDENT_AMBULATORY_CARE_PROVIDER_SITE_OTHER): Payer: BLUE CROSS/BLUE SHIELD | Admitting: Obstetrics and Gynecology

## 2019-01-19 VITALS — BP 117/79 | HR 93 | Wt 158.4 lb

## 2019-01-19 DIAGNOSIS — O409XX0 Polyhydramnios, unspecified trimester, not applicable or unspecified: Secondary | ICD-10-CM

## 2019-01-19 DIAGNOSIS — Z3480 Encounter for supervision of other normal pregnancy, unspecified trimester: Secondary | ICD-10-CM

## 2019-01-19 DIAGNOSIS — O09523 Supervision of elderly multigravida, third trimester: Secondary | ICD-10-CM

## 2019-01-19 DIAGNOSIS — Z3A31 31 weeks gestation of pregnancy: Secondary | ICD-10-CM

## 2019-01-19 DIAGNOSIS — O403XX0 Polyhydramnios, third trimester, not applicable or unspecified: Secondary | ICD-10-CM

## 2019-01-19 MED ORDER — ASPIRIN EC 81 MG PO TBEC: 81.0000 mg | DELAYED_RELEASE_TABLET | Freq: Every day | ORAL | 2 refills | Status: DC

## 2019-01-19 NOTE — Progress Notes (Signed)
   PRENATAL VISIT NOTE  Subjective:  Linda Moyer is a 38 y.o. G3P0020 at [redacted]w[redacted]d being seen today for ongoing prenatal care.  She is currently monitored for the following issues for this high-risk pregnancy and has Encounter for supervision of normal pregnancy, antepartum; AMA (advanced maternal age) multigravida 35+; and Polyhydramnios affecting pregnancy on their problem list.  Patient reports no complaints.  Contractions: Not present. Vag. Bleeding: None.  Movement: Present. Denies leaking of fluid.   The following portions of the patient's history were reviewed and updated as appropriate: allergies, current medications, past family history, past medical history, past social history, past surgical history and problem list.   Objective:   Vitals:   01/19/19 1559  BP: 117/79  Pulse: 93  Weight: 158 lb 6.4 oz (71.8 kg)    Fetal Status: Fetal Heart Rate (bpm): 144 Fundal Height: 30 cm Movement: Present     General:  Alert, oriented and cooperative. Patient is in no acute distress.  Skin: Skin is warm and dry. No rash noted.   Cardiovascular: Normal heart rate noted  Respiratory: Normal respiratory effort, no problems with respiration noted  Abdomen: Soft, gravid, appropriate for gestational age.  Pain/Pressure: Present     Pelvic: Cervical exam deferred        Extremities: Normal range of motion.  Edema: None  Mental Status: Normal mood and affect. Normal behavior. Normal judgment and thought content.   Assessment and Plan:  Pregnancy: U7O5366 at [redacted]w[redacted]d 1. Supervision of other normal pregnancy, antepartum Patient is doing well without complaints Patient reports generalized pruiritis Bile acids ordered  2. Polyhydramnios affecting pregnancy 10/6 ultrasound IUGR with AEDF Follow up dopplers tomorrow Delivery recommended between 32-34 weeks. Patient is hoping to be able to negotiate delivery past 34 weeks  3. Multigravida of advanced maternal age in third trimester   Preterm  labor symptoms and general obstetric precautions including but not limited to vaginal bleeding, contractions, leaking of fluid and fetal movement were reviewed in detail with the patient. Please refer to After Visit Summary for other counseling recommendations.   Return in about 1 week (around 01/26/2019) for ROB, High risk, Virtual.  Future Appointments  Date Time Provider [place]  01/20/2019  2:15 PM [place] [facility]-US  01/20/2019  2:15 PM [place] 4 WH-MFCUS MFC-US  01/20/2019  3:15 PM WH-MFC NST [place] MFC-US  01/24/2019  2:15 PM [place] NURSE [place] MFC-US  01/24/2019  2:15 PM [place] 4 WH-MFCUS MFC-US  01/27/2019  9:45 AM Clarnce Flock, MD [facility] None  01/27/2019  2:45 PM [employer] MFC-US  01/27/2019  2:45 PM [place] 2 WH-MFCUS MFC-US  01/27/2019  4:00 PM [place] NST [place] MFC-US  01/31/2019  2:45 PM [place] MFC-US  01/31/2019  2:45 PM [place] 2 WH-MFCUS MFC-US  02/03/2019  2:15 PM [place] MFC-US  02/03/2019  2:15 PM [place] 4 WH-MFCUS MFC-US  02/03/2019  3:15 PM WH-MFC NST WH-MFC MFC-US    Mora Bellman, MD

## 2019-01-20 ENCOUNTER — Encounter (HOSPITAL_COMMUNITY): Payer: Self-pay

## 2019-01-20 ENCOUNTER — Ambulatory Visit (HOSPITAL_COMMUNITY)
Admission: RE | Admit: 2019-01-20 | Discharge: 2019-01-20 | Disposition: A | Discharge status: Home / Self Care | Payer: BLUE CROSS/BLUE SHIELD | Source: Ambulatory Visit | Attending: Maternal & Fetal Medicine | Admitting: Maternal & Fetal Medicine

## 2019-01-20 ENCOUNTER — Ambulatory Visit (HOSPITAL_COMMUNITY): Payer: BLUE CROSS/BLUE SHIELD | Admitting: *Deleted

## 2019-01-20 DIAGNOSIS — O09523 Supervision of elderly multigravida, third trimester: Secondary | ICD-10-CM | POA: Insufficient documentation

## 2019-01-20 DIAGNOSIS — O409XX0 Polyhydramnios, unspecified trimester, not applicable or unspecified: Secondary | ICD-10-CM

## 2019-01-20 DIAGNOSIS — O365990 Maternal care for other known or suspected poor fetal growth, unspecified trimester, not applicable or unspecified: Secondary | ICD-10-CM | POA: Insufficient documentation

## 2019-01-20 DIAGNOSIS — O365930 Maternal care for other known or suspected poor fetal growth, third trimester, not applicable or unspecified: Secondary | ICD-10-CM | POA: Diagnosis present

## 2019-01-20 DIAGNOSIS — O289 Unspecified abnormal findings on antenatal screening of mother: Secondary | ICD-10-CM

## 2019-01-20 DIAGNOSIS — Z3A31 31 weeks gestation of pregnancy: Secondary | ICD-10-CM

## 2019-01-20 NOTE — Procedures (Signed)
Linda Moyer 1980/12/19 [redacted]w[redacted]d  Fetus A Non-Stress Test Interpretation for 01/20/19  Indication: IUGR  Fetal Heart Rate A Mode: External Baseline Rate (A): 135 bpm Variability: Moderate Accelerations: 10 x 10 Decelerations: None Multiple birth?: No  Uterine Activity Mode: Palpation, Toco Contraction Frequency (min): none Resting Tone Palpated: Relaxed Resting Time: Adequate  Interpretation (Fetal Testing) Nonstress Test Interpretation: Reactive Comments: Reviewed tracing with Dr. Donalee Citrin

## 2019-01-20 NOTE — Progress Notes (Signed)
8/19

## 2019-01-21 LAB — BILE ACIDS, TOTAL: Bile Acids Total: 8.5 umol/L (ref 0.0–10.0)

## 2019-01-24 ENCOUNTER — Other Ambulatory Visit: Payer: Self-pay

## 2019-01-24 ENCOUNTER — Ambulatory Visit (HOSPITAL_COMMUNITY)
Admission: RE | Admit: 2019-01-24 | Discharge: 2019-01-24 | Disposition: A | Discharge status: Home / Self Care | Payer: BLUE CROSS/BLUE SHIELD | Source: Ambulatory Visit | Attending: Maternal & Fetal Medicine | Admitting: Maternal & Fetal Medicine

## 2019-01-24 ENCOUNTER — Encounter (HOSPITAL_COMMUNITY): Payer: Self-pay

## 2019-01-24 ENCOUNTER — Ambulatory Visit (HOSPITAL_COMMUNITY): Payer: BLUE CROSS/BLUE SHIELD | Admitting: *Deleted

## 2019-01-24 DIAGNOSIS — O289 Unspecified abnormal findings on antenatal screening of mother: Secondary | ICD-10-CM

## 2019-01-24 DIAGNOSIS — O09523 Supervision of elderly multigravida, third trimester: Secondary | ICD-10-CM | POA: Diagnosis present

## 2019-01-24 DIAGNOSIS — O365930 Maternal care for other known or suspected poor fetal growth, third trimester, not applicable or unspecified: Secondary | ICD-10-CM | POA: Insufficient documentation

## 2019-01-24 DIAGNOSIS — O409XX0 Polyhydramnios, unspecified trimester, not applicable or unspecified: Secondary | ICD-10-CM | POA: Diagnosis present

## 2019-01-24 DIAGNOSIS — Z3A31 31 weeks gestation of pregnancy: Secondary | ICD-10-CM | POA: Diagnosis not present

## 2019-01-27 ENCOUNTER — Encounter: Payer: Self-pay | Admitting: Family Medicine

## 2019-01-27 ENCOUNTER — Ambulatory Visit (HOSPITAL_BASED_OUTPATIENT_CLINIC_OR_DEPARTMENT_OTHER)
Admission: RE | Admit: 2019-01-27 | Discharge: 2019-01-27 | Disposition: A | Discharge status: Home / Self Care | Payer: BLUE CROSS/BLUE SHIELD | Source: Ambulatory Visit | Attending: Maternal & Fetal Medicine | Admitting: Maternal & Fetal Medicine

## 2019-01-27 ENCOUNTER — Ambulatory Visit (HOSPITAL_COMMUNITY): Payer: BLUE CROSS/BLUE SHIELD | Admitting: *Deleted

## 2019-01-27 ENCOUNTER — Other Ambulatory Visit (HOSPITAL_COMMUNITY): Payer: Self-pay | Admitting: Maternal & Fetal Medicine

## 2019-01-27 ENCOUNTER — Telehealth (INDEPENDENT_AMBULATORY_CARE_PROVIDER_SITE_OTHER): Payer: BLUE CROSS/BLUE SHIELD | Admitting: Family Medicine

## 2019-01-27 ENCOUNTER — Other Ambulatory Visit: Payer: Self-pay

## 2019-01-27 ENCOUNTER — Encounter (HOSPITAL_COMMUNITY): Payer: Self-pay | Admitting: *Deleted

## 2019-01-27 DIAGNOSIS — Z3A32 32 weeks gestation of pregnancy: Secondary | ICD-10-CM | POA: Diagnosis not present

## 2019-01-27 DIAGNOSIS — O289 Unspecified abnormal findings on antenatal screening of mother: Secondary | ICD-10-CM

## 2019-01-27 DIAGNOSIS — O365930 Maternal care for other known or suspected poor fetal growth, third trimester, not applicable or unspecified: Secondary | ICD-10-CM

## 2019-01-27 DIAGNOSIS — O0993 Supervision of high risk pregnancy, unspecified, third trimester: Secondary | ICD-10-CM

## 2019-01-27 DIAGNOSIS — O0990 Supervision of high risk pregnancy, unspecified, unspecified trimester: Secondary | ICD-10-CM

## 2019-01-27 DIAGNOSIS — O09523 Supervision of elderly multigravida, third trimester: Secondary | ICD-10-CM

## 2019-01-27 DIAGNOSIS — O365990 Maternal care for other known or suspected poor fetal growth, unspecified trimester, not applicable or unspecified: Secondary | ICD-10-CM | POA: Insufficient documentation

## 2019-01-27 DIAGNOSIS — Z362 Encounter for other antenatal screening follow-up: Secondary | ICD-10-CM | POA: Diagnosis not present

## 2019-01-27 DIAGNOSIS — L299 Pruritus, unspecified: Secondary | ICD-10-CM | POA: Insufficient documentation

## 2019-01-27 NOTE — Progress Notes (Addendum)
I connected with Linda Moyer on 01/27/2019 at by: Webex and verified that I am speaking with the correct person using two identifiers.  Patient is located at home and provider is located at [place] office.     The purpose of this virtual visit is to provide medical care while limiting exposure to the novel coronavirus. I discussed the limitations, risks, security and privacy concerns of performing an evaluation and management service by Webex and the availability of in person appointments. I also discussed with the patient that there may be a patient responsible charge related to this service. By engaging in this virtual visit, you consent to the provision of healthcare.  Additionally, you authorize for your insurance to be billed for the services provided during this visit.  The patient expressed understanding and agreed to proceed.  The following staff members participated in the virtual visit:  Clarnce Flock MD/MPH  Subjective:  Linda Moyer is a 38 y.o. G3P0020 at [redacted]w[redacted]d being seen today for ongoing prenatal care.  She is currently monitored for the following issues for this high-risk pregnancy and has Supervision of high risk pregnancy, antepartum; AMA (advanced maternal age) multigravida 35+; Pregnancy affected by intrauterine growth retardation (IUGR); and Pruritus on their problem list.  Patient reports no bleeding, no contractions and no leaking.  Contractions: Not present. Vag. Bleeding: None.  Movement: Present. Denies leaking of fluid.   The following portions of the patient's history were reviewed and updated as appropriate: allergies, current medications, past family history, past medical history, past social history, past surgical history and problem list. Problem list updated.  Objective:  There were no vitals filed for this visit.  Fetal Status:     Movement: Present     General:  Alert, oriented and cooperative. Patient is in no acute distress.  Skin: Skin is warm and  dry. No rash noted.   Cardiovascular: Normal heart rate noted  Respiratory: Normal respiratory effort, no problems with respiration noted  Abdomen: Soft, gravid, appropriate for gestational age. Pain/Pressure: Present     Pelvic: Vag. Bleeding: None     n/a        Extremities: Normal range of motion.  Edema: None  Mental Status: Normal mood and affect. Normal behavior. Normal judgment and thought content.   Urinalysis:      Assessment and Plan:  Pregnancy: G3P0020 at [redacted]w[redacted]d  1. Multigravida of advanced maternal age in third trimester   2. Supervision of high risk pregnancy, antepartum -Prefers in clinic visit in 2 weeks - Reinforced importance of return precautions in light of severe IUGR, in particular decreased FM  3. Pregnancy affected by intrauterine growth retardation (IUGR) Extensive conversation around diagnosis of IUGR and reasoning for early delivery of infant. Explicitly discussed risk of stillbirth and proven benefit of early delivery for reducing this risk. Patient reports she does not plan to accept early induction prior to 37 weeks as she believes the risks of prematurity are greater. Extensively discussed risks of ongoing pregnancy and that on balance benefits outweigh continuing pregnancy past gestational age recommended by MFM. Offered a Neonatology consultation for patient so that she could further explore concerns regarding premature birth, patient is interested and urgent referral was placed.   4. Pruritus At last visit patient reporting generalized itching, bile acids 8.5 on 10/8 and today patient reports no itching since last week. Low suspicion for cholestasis at this time but regardless would not change management in setting of severe IUGR.   Preterm  labor symptoms and general obstetric precautions including but not limited to vaginal bleeding, contractions, leaking of fluid and fetal movement were reviewed in detail with the patient. Please refer to After Visit  Summary for other counseling recommendations.   Time spent on virtual visit: 15 minutes  Return in about 2 weeks (around 02/10/2019) for [facility], in person, needs MD.   Venora Maples, MD

## 2019-01-27 NOTE — Procedures (Signed)
Linda Moyer [birth year] 110w2d  Fetus A Non-Stress Test Interpretation for 01/27/19  Indication: IUGR  Fetal Heart Rate A Mode: External Baseline Rate (A): 145 bpm Variability: Moderate Accelerations: 10 x 10 Decelerations: None Multiple birth?: No  Uterine Activity Mode: Palpation, Toco Contraction Frequency (min): Occas. UC's w/UI Contraction Duration (sec): 10-60 Contraction Quality: Mild Resting Tone Palpated: Relaxed Resting Time: Adequate  Interpretation (Fetal Testing) Nonstress Test Interpretation: Non-reactive Comments: EFM tracing reviewed by Dr. Donalee Citrin

## 2019-01-27 NOTE — Progress Notes (Signed)
Pt is on the phone preparing for virtual visit with provider. [redacted]w[redacted]d.

## 2019-01-28 ENCOUNTER — Other Ambulatory Visit: Payer: Self-pay

## 2019-01-28 ENCOUNTER — Encounter (HOSPITAL_COMMUNITY): Payer: Self-pay | Admitting: *Deleted

## 2019-01-28 ENCOUNTER — Inpatient Hospital Stay (HOSPITAL_COMMUNITY)
Admission: AD | Admit: 2019-01-28 | Discharge: 2019-02-04 | DRG: 833 | Disposition: A | Discharge status: Home / Self Care | Payer: BLUE CROSS/BLUE SHIELD | Attending: Obstetrics and Gynecology | Admitting: Obstetrics and Gynecology

## 2019-01-28 DIAGNOSIS — O365990 Maternal care for other known or suspected poor fetal growth, unspecified trimester, not applicable or unspecified: Secondary | ICD-10-CM | POA: Diagnosis not present

## 2019-01-28 DIAGNOSIS — Z20828 Contact with and (suspected) exposure to other viral communicable diseases: Secondary | ICD-10-CM | POA: Diagnosis present

## 2019-01-28 DIAGNOSIS — O365930 Maternal care for other known or suspected poor fetal growth, third trimester, not applicable or unspecified: Principal | ICD-10-CM | POA: Diagnosis present

## 2019-01-28 DIAGNOSIS — Z3A33 33 weeks gestation of pregnancy: Secondary | ICD-10-CM | POA: Diagnosis not present

## 2019-01-28 DIAGNOSIS — Z3A32 32 weeks gestation of pregnancy: Secondary | ICD-10-CM

## 2019-01-28 DIAGNOSIS — O289 Unspecified abnormal findings on antenatal screening of mother: Secondary | ICD-10-CM | POA: Diagnosis not present

## 2019-01-28 DIAGNOSIS — Z87891 Personal history of nicotine dependence: Secondary | ICD-10-CM

## 2019-01-28 DIAGNOSIS — L299 Pruritus, unspecified: Secondary | ICD-10-CM | POA: Diagnosis present

## 2019-01-28 DIAGNOSIS — O26893 Other specified pregnancy related conditions, third trimester: Secondary | ICD-10-CM | POA: Diagnosis present

## 2019-01-28 DIAGNOSIS — O09523 Supervision of elderly multigravida, third trimester: Secondary | ICD-10-CM | POA: Diagnosis not present

## 2019-01-28 HISTORY — DX: Depression, unspecified: F32.A

## 2019-01-28 LAB — TYPE AND SCREEN
ABO/RH(D): O POS
Antibody Screen: NEGATIVE

## 2019-01-28 LAB — CREATININE, SERUM
Creatinine, Ser: 0.68 mg/dL (ref 0.44–1.00)
GFR calc Af Amer: >60 mL/min (ref >60)
GFR calc non Af Amer: >60 mL/min (ref >60)

## 2019-01-28 LAB — CBC
HCT: 34.1 % — ABNORMAL LOW (ref 36.0–46.0)
Hemoglobin: 12.1 g/dL (ref 12.0–15.0)
MCH: 32.3 pg (ref 26.0–34.0)
MCHC: 35.5 g/dL (ref 30.0–36.0)
MCV: 90.9 fL (ref 80.0–100.0)
Platelets: 254 10*3/uL (ref 150–400)
RBC: 3.75 MIL/uL — ABNORMAL LOW (ref 3.87–5.11)
RDW: 13.2 % (ref 11.5–15.5)
WBC: 15.4 10*3/uL — ABNORMAL HIGH (ref 4.0–10.5)
nRBC: 0 % (ref 0.0–0.2)

## 2019-01-28 LAB — ABO/RH: ABO/RH(D): O POS

## 2019-01-28 LAB — SARS CORONAVIRUS 2 BY RT PCR (HOSPITAL ORDER, PERFORMED IN ~~LOC~~ HOSPITAL LAB): SARS Coronavirus 2: NEGATIVE

## 2019-01-28 MED ORDER — PRENATAL MULTIVITAMIN CH
1.0000 | ORAL_TABLET | Freq: Every day | ORAL | Status: DC
  Administered 2019-01-28 – 2019-01-31 (×4): 1 via ORAL
  Filled 2019-01-28 (×4): qty 1

## 2019-01-28 MED ORDER — DOCUSATE SODIUM 100 MG PO CAPS
100.0000 mg | ORAL_CAPSULE | Freq: Every day | ORAL | Status: DC
  Administered 2019-01-30 – 2019-01-31 (×2): 100 mg via ORAL
  Filled 2019-01-28 (×4): qty 1

## 2019-01-28 MED ORDER — SODIUM CHLORIDE 0.9% FLUSH
3.0000 mL | Freq: Two times a day (BID) | INTRAVENOUS | Status: DC
  Administered 2019-01-29: 3 mL via INTRAVENOUS

## 2019-01-28 MED ORDER — BETAMETHASONE SOD PHOS & ACET 6 (3-3) MG/ML IJ SUSP
12.0000 mg | INTRAMUSCULAR | Status: AC
  Administered 2019-01-28 – 2019-01-29 (×2): 12 mg via INTRAMUSCULAR
  Filled 2019-01-28 (×2): qty 2

## 2019-01-28 MED ORDER — ASPIRIN EC 81 MG PO TBEC
81.0000 mg | DELAYED_RELEASE_TABLET | Freq: Every day | ORAL | Status: DC
  Administered 2019-01-29 – 2019-01-31 (×3): 81 mg via ORAL
  Filled 2019-01-28 (×5): qty 1

## 2019-01-28 MED ORDER — FERROUS SULFATE 325 (65 FE) MG PO TABS
325.0000 mg | ORAL_TABLET | Freq: Every day | ORAL | Status: DC
  Administered 2019-01-29 – 2019-02-01 (×4): 325 mg via ORAL
  Filled 2019-01-28 (×4): qty 1

## 2019-01-28 MED ORDER — VITAMIN D 25 MCG (1000 UNIT) PO TABS
1000.0000 [IU] | ORAL_TABLET | Freq: Every day | ORAL | Status: DC
  Administered 2019-01-29 – 2019-02-01 (×4): 1000 [IU] via ORAL
  Filled 2019-01-28 (×6): qty 1

## 2019-01-28 MED ORDER — SODIUM CHLORIDE 0.9% FLUSH: 3.0000 mL | INTRAVENOUS | Status: DC | PRN

## 2019-01-28 MED ORDER — ZOLPIDEM TARTRATE 5 MG PO TABS: 5.0000 mg | ORAL_TABLET | Freq: Every evening | ORAL | Status: DC | PRN

## 2019-01-28 MED ORDER — CALCIUM CARBONATE ANTACID 500 MG PO CHEW: 2.0000 | CHEWABLE_TABLET | ORAL | Status: DC | PRN

## 2019-01-28 MED ORDER — SODIUM CHLORIDE 0.9 % IV SOLN: 250.0000 mL | INTRAVENOUS | Status: DC | PRN

## 2019-01-28 MED ORDER — ACETAMINOPHEN 325 MG PO TABS: 650.0000 mg | ORAL_TABLET | ORAL | Status: DC | PRN

## 2019-01-28 MED ORDER — ENOXAPARIN SODIUM 40 MG/0.4ML ~~LOC~~ SOLN
40.0000 mg | SUBCUTANEOUS | Status: DC
  Administered 2019-01-28 – 2019-01-30 (×2): 40 mg via SUBCUTANEOUS
  Filled 2019-01-28 (×5): qty 0.4

## 2019-01-28 NOTE — H&P (Signed)
Linda Moyer is an 38 y.o. G1P0020 32w3dfemale.   Chief Complaint: growth restriction of baby  HPI: Advised to come in for admission today, due to IUGR with AEDF and one REDF on Dopplers yesterday with NR NST. She has been feeling the baby move well. She is s/p BMZ x 2 at end of August and needs these repeated. PNC at [facility] neg Diabetes screen, IUGR.  Past Medical History:  Diagnosis Date  . Allergy   . Chlamydia   . Depression   . Ectopic pregnancy 06/15/2016   Methotrexate  . Gonorrhea   . MRSA infection 2008  . Polyhydramnios affecting pregnancy 11/09/2018   Guidelines for Antenatal Testing and Sonography  (with updated ICD-10 codes)  Updated  72020/08/07with Dr. RTama High INDICATION U/S 2 X week NST/AFI  or full BPP wkly DELIVERY Polyhydramnios (moderate to severe) - O40.9XX0  (AFI>30, normal anatomy, no other comorbidities) **No testing for mild (AFI<30)** Q 4 wks 32 39         . Trichomonas infection   . Vaginal Pap smear, abnormal    cone bx, ok since    Past Surgical History:  Procedure Laterality Date  . CERVICAL CONE BIOPSY      Family History  Problem Relation Age of Onset  . Asthma Mother   . Hypertension Mother   . Hypertension Father   . Asthma Sister   . Asthma Brother    Social History:  reports that she quit smoking about a year ago. Her smoking use included cigarettes. She smoked 0.10 packs per day. She has never used smokeless tobacco. She reports previous alcohol use. She reports that she does not use drugs.    Allergies  Allergen Reactions  . Pollen Extract Other (See Comments)    Medications Prior to Admission  Medication Sig Dispense Refill  . aspirin EC 81 MG tablet Take 1 tablet (81 mg total) by mouth daily. Take after 12 weeks for prevention of preeclampsia later in pregnancy 300 tablet 2  . calcium carbonate (OS-CAL) 1250 (500 Ca) MG chewable tablet Chew 1 tablet by mouth daily.    . cholecalciferol (VITAMIN D3) 25 MCG (1000  UT) tablet Take 1,000 Units by mouth daily.    . Prenatal Vit w/Fe-Methylfol-FA (PNV PO) Take by mouth.    . Blood Pressure KIT To Be Monitored Regularly at Home z34.90 Large cuff (Patient not taking: Reported on 10/12/2018) 1 each 0  . cetirizine (ZYRTEC) 10 MG tablet Take 1 tablet (10 mg total) by mouth daily. (Patient not taking: Reported on 01/06/2019) 10 tablet 0  . Elastic Bandages & Supports (COMFORT FIT MATERNITY SUPP SM) MISC 1 Units by Does not apply route daily as needed. (Patient not taking: Reported on 01/27/2019) 1 each 0  . ferrous sulfate 325 (65 FE) MG tablet Take 325 mg by mouth daily with breakfast.       A comprehensive review of systems was negative.  Last menstrual period 06/15/2018, unknown if currently breastfeeding. LMP 06/15/2018  General appearance: alert, cooperative and appears stated age Head: Normocephalic, without obvious abnormality, atraumatic Neck: supple, symmetrical, trachea midline Lungs: normal effort Heart: regular rate and rhythm Abdomen: gravid, size < dates, non-tender Extremities: Homans sign is negative, no sign of DVT Skin: Skin color, texture, turgor normal. No rashes or lesions Neurologic: Grossly normal NST:  Baseline: 140 bpm, Variability: Good {> 6 bpm), Accelerations: Reactive and Decelerations: Absent  U/S 03/29/19, vtx, nml fluid EFW 1445 gms, 3  lb 3 oz, persistent AEDF with some REDF noted   Lab Results  Component Value Date   WBC 15.9 (H) 12/22/2018   HGB 12.2 12/22/2018   HCT 37.1 12/22/2018   MCV 93 12/22/2018   PLT 221 12/22/2018         ABO, Rh: O/Positive/-- (07/01 1610)  Antibody: Negative (07/01 1610)  Rubella: 4.19 (07/01 1610)  RPR: Non Reactive (09/10 0835)  HBsAg: Negative (07/01 1610)  HIV: Non Reactive (09/10 0835)  GBS:       Assessment/Plan Principal Problem:   Pregnancy affected by intrauterine growth retardation (IUGR) Active Problems:   IUGR (intrauterine growth restriction) affecting care of  mother Reassuring FHR  For 2nd course of betamethasone Inpt, daily monitoring NICU consult Repeat Dopplers Monday Likely delivery at 33 wks.  [name] 01/28/2019, 1:16 PM

## 2019-01-29 LAB — RPR: RPR Ser Ql: NONREACTIVE

## 2019-01-29 NOTE — Progress Notes (Signed)
Dr. Kennon Rounds updated about non-reactive NST and was also made aware of pt's refusal of IV. Received verbal order to provide pt with some juice. Gave pt 8 oz of grape juice. Will continue to monitor.

## 2019-01-29 NOTE — Progress Notes (Signed)
Pt lost IV access. Pt is refusing another IV start. Explained importance of IV access in the event that something were to happen with her or the baby. Pt is still refusing.

## 2019-01-29 NOTE — Plan of Care (Signed)
Patient is on board with plan to remain in hospital until 33 weeks off pregnancy.

## 2019-01-29 NOTE — Progress Notes (Signed)
Patient ID: Linda Moyer, female   DOB: 1981-04-13, 38 y.o.   MRN: [id number] FACULTY PRACTICE ANTEPARTUM(COMPREHENSIVE) NOTE  RON JUNCO is a 38 y.o. G3P0020 at [redacted]w[redacted]d by early ultrasound who is admitted for IUGR with AEDF.   Fetal presentation is cephalic. Length of Stay:  1  Days  ASSESSMENT: Principal Problem:   Pregnancy affected by intrauterine growth retardation (IUGR) Active Problems:   IUGR (intrauterine growth restriction) affecting care of mother  PLAN: Continue inpt. Management BMZ #2 today Repeat Dopplers Monday am Discussed delivery plan She hopes to remain pregnant past 33 wks.  Subjective: Feels well, noted to be under a lot of stress. Patient reports the fetal movement as active. Patient reports uterine contraction  activity as none. Patient reports  vaginal bleeding as none. Patient describes fluid per vagina as None.  Vitals:  Blood pressure 111/66, pulse (!) 57, temperature 98 F (36.7 C), temperature source Oral, resp. rate 16, height 5' 5.5" (1.664 m), weight 72.3 kg, last menstrual period 06/15/2018, SpO2 100 %, unknown if currently breastfeeding. Physical Examination:  General appearance - alert, well appearing, and in no distress Chest - normal effort Abdomen - gravid, non-tender Fundal Height:  size less than dates Extremities: Homans sign is negative, no sign of DVT  Membranes: intact  Fetal Monitoring:  Baseline: 135 bpm, Variability: Good {> 6 bpm), Accelerations: Reactive and Decelerations: Absent  Labs:  Results for orders placed or performed during the hospital encounter of 01/28/19 (from the past 24 hour(s))  CBC on admission   Collection Time: 01/28/19  1:57 PM  Result Value Ref Range   WBC 15.4 (H) 4.0 - 10.5 K/uL   RBC 3.75 (L) 3.87 - 5.11 MIL/uL   Hemoglobin 12.1 12.0 - 15.0 g/dL   HCT 76.5 (L) 46.5 - 03.5 %   MCV 90.9 80.0 - 100.0 fL   MCH 32.3 26.0 - 34.0 pg   MCHC 35.5 30.0 - 36.0 g/dL   RDW 46.5 68.1 - 27.5 %   Platelets  254 150 - 400 K/uL   nRBC 0.0 0.0 - 0.2 %  Creatinine, serum   Collection Time: 01/28/19  1:57 PM  Result Value Ref Range   Creatinine, Ser 0.68 0.44 - 1.00 mg/dL   GFR calc non Af Amer >60 >60 mL/min   GFR calc Af Amer >60 >60 mL/min  RPR   Collection Time: 01/28/19  1:57 PM  Result Value Ref Range   RPR Ser Ql NON REACTIVE NON REACTIVE  Type and screen [facility]   Collection Time: 01/28/19  1:57 PM  Result Value Ref Range   ABO/RH(D) O POS    Antibody Screen NEG    Sample Expiration      01/31/2019,2359 Performed at [facility], [address] [place], [place] [address]   ABO/Rh   Collection Time: 01/28/19  1:57 PM  Result Value Ref Range   ABO/RH(D)      O POS Performed at [facility], [address] [place], [place] [address]     Medications:  Scheduled . aspirin EC  81 mg Oral Daily  . betamethasone acetate-betamethasone sodium phosphate  12 mg Intramuscular Q24H  . cholecalciferol  1,000 Units Oral Daily  . docusate sodium  100 mg Oral Daily  . enoxaparin (LOVENOX) injection  40 mg Subcutaneous Q24H  . ferrous sulfate  325 mg Oral Q breakfast  . prenatal multivitamin  1 tablet Oral Q1200  . sodium chloride flush  3 mL Intravenous Q12H   I have reviewed the patient's current medications.   [name], MD 01/29/2019,12:22 PM

## 2019-01-30 ENCOUNTER — Inpatient Hospital Stay (HOSPITAL_COMMUNITY): Payer: BLUE CROSS/BLUE SHIELD

## 2019-01-30 DIAGNOSIS — O09523 Supervision of elderly multigravida, third trimester: Secondary | ICD-10-CM

## 2019-01-30 DIAGNOSIS — O365930 Maternal care for other known or suspected poor fetal growth, third trimester, not applicable or unspecified: Principal | ICD-10-CM

## 2019-01-30 DIAGNOSIS — Z3A32 32 weeks gestation of pregnancy: Secondary | ICD-10-CM

## 2019-01-30 DIAGNOSIS — O289 Unspecified abnormal findings on antenatal screening of mother: Secondary | ICD-10-CM

## 2019-01-30 NOTE — Consult Note (Signed)
[facility] [place]  Prenatal Consult       01/30/2019  7:24 PM   I was asked by Dr. Elonda Husky to consult on this patient for possible preterm delivery. I had the pleasure of meeting with Ms. Amedeo Plenty today. Her partner was on speaker phone for the conversation. She is a 38 year old G3P0020 who is at [redacted]w[redacted]d today. There is fetal growth restriction with AEDF on UA dopplers. Occasionally, REDF has been seen. On today's assessment, OB noted a normal BPP and AEDF. The tentative plan is for delivery at 33 weeks given these findings. She has received two courses of BMZ, most recently on 10/17 and 10/18. The EFW as of 10/16 is 1445 grams (1%ile). She is expecting a baby boy.   I explained that the neonatal intensive care team would be present for the delivery and outlined the likely delivery room course for this baby including routine resuscitation and NRP-guided approaches to the treatment of respiratory distress. We discussed other common problems associated with prematurity including respiratory distress syndrome/CLD, feeding issues, and temperature regulation. We briefly discussed IVH/PVL, ROP, and NEC and that these are complications associated with prematurity, but that by 30 weeks are uncommon. I did note that her baby is at somewhat higher risk, particularly for feeding intolerance and ROP, compared to other 33 week infants given his small size.  We discussed the average length of stay but I noted that the actual LOS would depend on the severity of problems encountered and response to treatments. We discussed visitation policies and the resources available while her child is in the hospital.  We discussed the importance of good nutrition and various methods of providing nutrition (parenteral hyperalimentation, gavage feedings and/or oral feeding). We discussed the benefits of human milk. However, Ms. Heberlein plans to bottle feed her baby formula and is not interested in breast feeding or  pumping.   Thank you for involving [place] in the care of this patient. A member of our team will be available should the family have additional questions. Time for consultation approximately 20 minutes.  Renato Shin, MD Neonatal Medicine

## 2019-01-30 NOTE — Progress Notes (Signed)
Patient ID: Linda Moyer, female   DOB: 1980/06/12, 38 y.o.   MRN: [id number] [facility]) NOTE  Linda Moyer is a 38 y.o. G3P0020 with Estimated Date of Delivery: 03/22/19   By  early ultrasound [redacted]w[redacted]d  who is admitted for FGR severe with AEDF and intermittent REDF.    Fetal presentation is cephalic. Length of Stay:  2  Days  Date of admission:01/28/2019  Subjective:  Patient reports the fetal movement as active. Patient reports uterine contraction  activity as none. Patient reports  vaginal bleeding as none. Patient describes fluid per vagina as None.  Vitals:  Blood pressure 104/62, pulse (!) 55, temperature 98.1 F (36.7 C), temperature source Oral, resp. rate 16, height 5' 5.5" (1.664 m), weight 72.3 kg, last menstrual period 06/15/2018, SpO2 100 %, unknown if currently breastfeeding. Vitals:   01/29/19 1921 01/29/19 2308 01/29/19 2310 01/30/19 0525  BP: (!) 124/55 109/61  104/62  Pulse: 61 (!) 56  (!) 55  Resp: 17 16  16   Temp: 98.1 F (36.7 C) 98.2 F (36.8 C)  98.1 F (36.7 C)  TempSrc: Oral Oral  Oral  SpO2: 100%  100% 100%  Weight:      Height:       Physical Examination:  General appearance - alert, well appearing, and in no distress Abdomen - soft, nontender, nondistended, no masses or organomegaly Fundal Height:  size equals dates Pelvic Exam:  examination not indicated Cervical Exam: Not evaluated. and found to be not evaluated Extremities: extremities normal, atraumatic, no cyanosis or edema with DTRs 2+ bilaterally Membranes:intact  Fetal Monitoring:  Baseline: 135 bpm, Variability: Good {> 6 bpm), Accelerations: Reactive and Decelerations: Absent   reactive  Labs:  No results found for this or any previous visit (from the past 24 hour(s)).  Imaging Studies:    See MFM sonogram reports  Medications:  Scheduled . aspirin EC  81 mg Oral Daily  . cholecalciferol  1,000 Units Oral Daily  . docusate sodium  100 mg Oral Daily   . enoxaparin (LOVENOX) injection  40 mg Subcutaneous Q24H  . ferrous sulfate  325 mg Oral Q breakfast  . prenatal multivitamin  1 tablet Oral Q1200  . sodium chloride flush  3 mL Intravenous Q12H   I have reviewed the patient's current medications.  ASSESSMENT: [id number] [redacted]w[redacted]d Estimated Date of Delivery: 03/22/19  Patient Active Problem List   Diagnosis Date Noted  . IUGR (intrauterine growth restriction) affecting care of mother 01/28/2019  . Pregnancy affected by intrauterine growth retardation (IUGR) 01/27/2019  . Pruritus 01/27/2019  . AMA (advanced maternal age) multigravida 35+ 10/12/2018  . Supervision of high risk pregnancy, antepartum 09/27/2018    PLAN: >S/p 2nd course of steroids >repeat Dopplers today  Plan if Doppler flow studies remain as they have is to deliver at 33 weeks per Dr Jaquita Rector sonogram note 01/27/2019  Linda Moyer 01/30/2019,8:41 AM

## 2019-01-31 ENCOUNTER — Ambulatory Visit (HOSPITAL_COMMUNITY): Payer: BLUE CROSS/BLUE SHIELD

## 2019-01-31 NOTE — Progress Notes (Signed)
Patient ID: Linda Moyer, female   DOB: [birth year], 38 y.o.   MRN: [id number] FACULTY PRACTICE ANTEPARTUM(COMPREHENSIVE) NOTE  Linda Moyer is a 38 y.o. G3P0020 with Estimated Date of Delivery: 03/22/19   By   [redacted]w[redacted]d  who is admitted for AEDF/intermittent REDF.    Fetal presentation is cephalic. Length of Stay:  3  Days  Date of admission:01/28/2019  Subjective: No complaints Patient reports the fetal movement as active. Patient reports uterine contraction  activity as none. Patient reports  vaginal bleeding as none. Patient describes fluid per vagina as None.  Vitals:  Blood pressure (!) 97/57, pulse 61, temperature 98 F (36.7 C), temperature source Oral, resp. rate 17, height 5' 5.5" (1.664 m), weight 72.3 kg, last menstrual period 06/15/2018, SpO2 95 %, unknown if currently breastfeeding. Vitals:   01/30/19 1342 01/30/19 1642 01/30/19 2027 01/31/19 0037  BP: 118/62 (!) 109/58 110/70 (!) 97/57  Pulse: 62 66 62 61  Resp: 16 17  17   Temp: 98.1 F (36.7 C) 97.7 F (36.5 C)  98 F (36.7 C)  TempSrc: Oral Oral  Oral  SpO2: 100% 99%  95%  Weight:      Height:       Physical Examination:  General appearance - alert, well appearing, and in no distress Abdomen - soft, nontender, nondistended, no masses or organomegaly Fundal Height:  size equals dates Pelvic Exam:  examination not indicated Cervical Exam: Not evaluated. Extremities: extremities normal, atraumatic, no cyanosis or edema with DTRs 2+ bilaterally Membranes:intact  Fetal Monitoring:  Baseline: 130 bpm, Variability: Good {> 6 bpm), Accelerations: borderline but BPP 8/8 and Decelerations: Absent   equivocal  Labs:  No results found for this or any previous visit (from the past 24 hour(s)).  Imaging Studies:      01/30/2019 11:27 AM 01/30/2019 12:10 PM  Study Result  ----------------------------------------------------------------------  OBSTETRICS REPORT                       (Signed Final 01/30/2019 01:31  pm) ---------------------------------------------------------------------- Patient Info  ID #:       02/01/2019                          D.O.B.:  Oct 20, 1980 (38 yrs)  Name:       [birth year] Mennella                  Visit Date: 01/30/2019 11:58 am ---------------------------------------------------------------------- Performed By  Performed By:     02/01/2019          Ref. Address:     [address]                                                             [place],  [place]                                                             [address]  Attending:        Tama High MD        Secondary Phy.:   [facility]                                                             Care  Referred By:      [name]          Location:         [facility]                    MD                                       [place] ---------------------------------------------------------------------- Orders   #  Description                          Code         Ordered By   1  [place] MFM UA CORD DOPPLER               76820.02     [place]   2  [place] MFM FETAL BPP WO NON              76819.01     TANYA PRATT      STRESS  ----------------------------------------------------------------------   #  Order #                    Accession #                 Episode #   1  [id number]   2  [id number]                  [id number]  ---------------------------------------------------------------------- Indications   Maternal care for known or suspected poor      O36.5930   fetal growth, third trimester, not applicable or   unspecified IUGR   [redacted] weeks gestation of pregnancy                Z3A.56   Advanced maternal age multigravida 40+,        O19.523   third trimester   Abnormal finding on antenatal screening        O28.9   (abnormal UAD)    Small for gestational age fetus affecting      O7.5990   management of mother  ---------------------------------------------------------------------- Vital Signs  Height:        5'5" ---------------------------------------------------------------------- Fetal Evaluation  Num Of Fetuses:         1  Fetal Heart Rate(bpm):  136  Cardiac Activity:       Observed  Presentation:           Cephalic  Placenta:               Anterior  Amniotic Fluid  AFI FV:      Within normal limits  AFI Sum(cm)     %Tile       Largest Pocket(cm)  14.1            48          4.6  RUQ(cm)       RLQ(cm)       LUQ(cm)        LLQ(cm)  4.6           4.2           3              2.3 ---------------------------------------------------------------------- Biophysical Evaluation  Amniotic F.V:   Within normal limits       F. Tone:        Observed  F. Movement:    Observed                   Score:          8/8  F. Breathing:   Observed ---------------------------------------------------------------------- OB History  Gravidity:    3         Term:   0        Prem:   0        SAB:   0  TOP:          1       Ectopic:  1        Living: 0 ---------------------------------------------------------------------- Gestational Age  LMP:           32w 5d        Date:  06/15/18                 EDD:   03/22/19  Best:          Armida Sans 5d     Det. By:  LMP  (06/15/18)          EDD:   03/22/19 ---------------------------------------------------------------------- Anatomy  Stomach:               Appears normal, left   Bladder:                Appears normal                         sided ---------------------------------------------------------------------- Doppler - Fetal Vessels  Umbilical Artery                                                            ADFV    RDFV  Yes       No ---------------------------------------------------------------------- Impression  Patient with severe fetal growth restriction was admitted for  closer fetal surveillance.  On ultrasound, amniotic fluid is normal and good fetal activity  seen.  Antenatal testing is reassuring.  BPP is 8/8.  Umbilical  artery Doppler showed persistent absent end-diastolic flow  with no reversed flow.  NST is reassuring.  Cephalic presentation.  According to current S MFM guidelines, delivery is indicated  between 33 and 34 weeks if absent end-diastolic flow is  seen.  Given that she had some intermittent reversed end-  diastolic flow in the previous scans, I recommend delivery at  33 weeks.  Induction of labor may lead to nonreassuring fetal trace and  emergent cesarean delivery.  Elective cesarean delivery is an  option and the patient may be counseled accordingly. ---------------------------------------------------------------------- Recommendations  -Delivery at 33 weeks' gestation. ----------------------------------------------------------------------                  Noralee Spaceavi Shankar, MD Electronically Signed Final Report   01/30/2019 01:31 pm      Medications:  Scheduled . aspirin EC  81 mg Oral Daily  . cholecalciferol  1,000 Units Oral Daily  . docusate sodium  100 mg Oral Daily  . enoxaparin (LOVENOX) injection  40 mg Subcutaneous Q24H  . ferrous sulfate  325 mg Oral Q breakfast  . prenatal multivitamin  1 tablet Oral Q1200  . sodium chloride flush  3 mL Intravenous Q12H   I have reviewed the patient's current medications.  ASSESSMENT: [id number] 3565w6d Estimated Date of Delivery: 03/22/19  Patient Active Problem List   Diagnosis Date Noted  . IUGR (intrauterine growth restriction) affecting care of mother 01/28/2019  . Pregnancy affected by intrauterine growth retardation (IUGR) 01/27/2019  . Pruritus 01/27/2019  . AMA (advanced maternal age) multigravida 35+ 10/12/2018  .  Supervision of high risk pregnancy, antepartum 09/27/2018    PLAN: >IOL scheduled for in am, pt understands significant chance of baby not tolerating the IOL and need for Caesarean delivery for fetal indications(see Dr Zannie KehrShankar's notes from sonograms the last few), see copy and paste above of yesterday's sonogram report  Amaryllis DykeLuther H  01/31/2019,7:34 AM

## 2019-01-31 NOTE — Progress Notes (Signed)
1515- patient tired of being on the EFM and removed herself from the monitor. Discussed with patient Dr. Harolyn Rutherford asked to keep her on the monitor because FHR was not reactive. Patient was instructed that refusal of fetal monitoring is against medical advice and with the fetal diagnosis of AEDF the risk of fetal demise is increased. Patient aware of concerns and states +FM.

## 2019-02-01 ENCOUNTER — Inpatient Hospital Stay (HOSPITAL_COMMUNITY): Payer: BLUE CROSS/BLUE SHIELD

## 2019-02-01 ENCOUNTER — Other Ambulatory Visit: Payer: Self-pay

## 2019-02-01 LAB — CBC
HCT: 34.5 % — ABNORMAL LOW (ref 36.0–46.0)
Hemoglobin: 11.5 g/dL — ABNORMAL LOW (ref 12.0–15.0)
MCH: 30.9 pg (ref 26.0–34.0)
MCHC: 33.3 g/dL (ref 30.0–36.0)
MCV: 92.7 fL (ref 80.0–100.0)
Platelets: 195 10*3/uL (ref 150–400)
RBC: 3.72 MIL/uL — ABNORMAL LOW (ref 3.87–5.11)
RDW: 13.3 % (ref 11.5–15.5)
WBC: 18.5 10*3/uL — ABNORMAL HIGH (ref 4.0–10.5)
nRBC: 0.4 % — ABNORMAL HIGH (ref 0.0–0.2)

## 2019-02-01 LAB — TYPE AND SCREEN
ABO/RH(D): O POS
Antibody Screen: NEGATIVE

## 2019-02-01 MED ORDER — OXYTOCIN BOLUS FROM INFUSION
500.0000 mL | Freq: Once | INTRAVENOUS | Status: AC
  Administered 2019-02-02: 500 mL via INTRAVENOUS

## 2019-02-01 MED ORDER — ACETAMINOPHEN 325 MG PO TABS: 650.0000 mg | ORAL_TABLET | ORAL | Status: DC | PRN

## 2019-02-01 MED ORDER — SOD CITRATE-CITRIC ACID 500-334 MG/5ML PO SOLN
30.0000 mL | ORAL | Status: DC | PRN
  Administered 2019-02-01: 30 mL via ORAL
  Filled 2019-02-01: qty 30

## 2019-02-01 MED ORDER — MISOPROSTOL 25 MCG QUARTER TABLET
ORAL_TABLET | ORAL | Status: AC
  Filled 2019-02-01: qty 1

## 2019-02-01 MED ORDER — MISOPROSTOL 25 MCG QUARTER TABLET
25.0000 ug | ORAL_TABLET | ORAL | Status: DC
  Administered 2019-02-01 (×2): 25 ug via VAGINAL
  Filled 2019-02-01 (×5): qty 1

## 2019-02-01 MED ORDER — LACTATED RINGERS IV SOLN
INTRAVENOUS | Status: DC
  Administered 2019-02-01 – 2019-02-02 (×4): via INTRAVENOUS

## 2019-02-01 MED ORDER — FENTANYL CITRATE (PF) 100 MCG/2ML IJ SOLN
100.0000 ug | INTRAMUSCULAR | Status: DC | PRN
  Administered 2019-02-01 – 2019-02-02 (×2): 100 ug via INTRAVENOUS
  Filled 2019-02-01 (×2): qty 2

## 2019-02-01 MED ORDER — ONDANSETRON HCL 4 MG/2ML IJ SOLN: 4.0000 mg | Freq: Four times a day (QID) | INTRAMUSCULAR | Status: DC | PRN

## 2019-02-01 MED ORDER — LACTATED RINGERS IV SOLN
500.0000 mL | INTRAVENOUS | Status: DC | PRN
  Administered 2019-02-02: 500 mL via INTRAVENOUS

## 2019-02-01 MED ORDER — TERBUTALINE SULFATE 1 MG/ML IJ SOLN: 0.2500 mg | Freq: Once | INTRAMUSCULAR | Status: DC | PRN

## 2019-02-01 MED ORDER — SODIUM CHLORIDE 0.9 % IV SOLN
5.0000 10*6.[IU] | Freq: Once | INTRAVENOUS | Status: AC
  Administered 2019-02-01: 5 10*6.[IU] via INTRAVENOUS
  Filled 2019-02-01: qty 5

## 2019-02-01 MED ORDER — PENICILLIN G 3 MILLION UNITS IVPB - SIMPLE MED
3.0000 10*6.[IU] | INTRAVENOUS | Status: DC
  Administered 2019-02-01 – 2019-02-02 (×6): 3 10*6.[IU] via INTRAVENOUS
  Filled 2019-02-01 (×6): qty 100

## 2019-02-01 MED ORDER — OXYTOCIN 40 UNITS IN NORMAL SALINE INFUSION - SIMPLE MED: 2.5000 [IU]/h | INTRAVENOUS | Status: DC

## 2019-02-01 MED ORDER — LIDOCAINE HCL (PF) 1 % IJ SOLN: 30.0000 mL | INTRAMUSCULAR | Status: DC | PRN

## 2019-02-01 NOTE — Progress Notes (Signed)
LABOR PROGRESS NOTE  Linda Moyer is a 38 y.o. G3P0020 at [redacted]w[redacted]d  admitted for IOL for IUGR  Subjective: Patients doing well, comfortable   Objective: BP 119/65   Pulse (!) 59   Temp 98.1 F (36.7 C) (Axillary)   Resp 16   Ht 5' 5.5" (1.664 m)   Wt 72.3 kg   LMP 06/15/2018   SpO2 100%   BMI 26.12 kg/m  or  Vitals:   02/01/19 1044 02/01/19 1233 02/01/19 1333 02/01/19 1459  BP: 120/75 112/60 114/62 119/65  Pulse: (!) 52 66 72 (!) 59  Resp: 16  18 16   Temp:  98.1 F (36.7 C)    TempSrc:  Axillary    SpO2:      Weight:      Height:        FB placed @ 1515 Dilation: 1 Effacement (%): 50, 60 Cervical Position: Posterior Station: -3 Presentation: Vertex Exam by:: [employer] RN FHT: baseline rate 145, moderate varibility, +accel, variable decel with 2 late decelerations  Toco: 1.5-3  Labs: Lab Results  Component Value Date   WBC 15.4 (H) 01/28/2019   HGB 12.1 01/28/2019   HCT 34.1 (L) 01/28/2019   MCV 90.9 01/28/2019   PLT 254 01/28/2019    Patient Active Problem List   Diagnosis Date Noted  . IUGR (intrauterine growth restriction) affecting care of mother 01/28/2019  . Pregnancy affected by intrauterine growth retardation (IUGR) 01/27/2019  . Pruritus 01/27/2019  . AMA (advanced maternal age) multigravida 35+ 10/12/2018  . Supervision of high risk pregnancy, antepartum 09/27/2018    Assessment / Plan: 38 y.o. G3P0020 at [redacted]w[redacted]d here for IOL for IUGR  Labor: FB placed, will hold cytotec for now d/t late decelerations with cytotec  Fetal Wellbeing:  Cat II  Pain Control:  Plans natural labor  Anticipated MOD:  SVD  Lajean Manes, CNM 02/01/2019, 3:28 PM

## 2019-02-01 NOTE — Progress Notes (Signed)
LABOR PROGRESS NOTE  [name] is a 38 y.o. G3P0020 at [redacted]w[redacted]d  admitted for IUGR, AEDF, intermittent REDF. Admitted to antenatal on 10/17- transferred to L&D for delivery this morning at 33 weeks.   Subjective: Patient doing well, denies cramping or contractions.   Objective: BP 120/75   Pulse (!) 52   Temp 98.3 F (36.8 C) (Oral)   Resp 16   Ht 5' 5.5" (1.664 m)   Wt 72.3 kg   LMP 06/15/2018   SpO2 100%   BMI 26.12 kg/m  or  Vitals:   02/01/19 0607 02/01/19 0821 02/01/19 0924 02/01/19 1044  BP: (!) 110/57 128/73 117/76 120/75  Pulse: (!) 53 71 (!) 56 (!) 52  Resp: 16  18 16   Temp: 97.9 F (36.6 C) 98.3 F (36.8 C)    TempSrc: Oral Oral    SpO2: 100%     Weight:      Height:        Dilation: Fingertip Effacement (%): Thick Cervical Position: Posterior Station: -3 Presentation: Vertex Exam by:: Jeanann Lewandowsky RN FHT: baseline rate 135, moderate varibility, +accel, no decel Toco: occasional   Labs: Lab Results  Component Value Date   WBC 15.4 (H) 01/28/2019   HGB 12.1 01/28/2019   HCT 34.1 (L) 01/28/2019   MCV 90.9 01/28/2019   PLT 254 01/28/2019    Patient Active Problem List   Diagnosis Date Noted  . IUGR (intrauterine growth restriction) affecting care of mother 01/28/2019  . Pregnancy affected by intrauterine growth retardation (IUGR) 01/27/2019  . Pruritus 01/27/2019  . AMA (advanced maternal age) multigravida 35+ 10/12/2018  . Supervision of high risk pregnancy, antepartum 09/27/2018    Assessment / Plan: 38 y.o. G3P0020 at [redacted]w[redacted]d here for IOL for IUGR, AEDF, intermittent REDF  Labor: IOL with cytotec, FB to be placed at next cervical examination  Fetal Wellbeing:  Cat I  Pain Control:  Patient plans natural labor and delivery  Anticipated MOD:  SVD  Linda Moyer, CNM 02/01/2019, 12:31 PM

## 2019-02-01 NOTE — Progress Notes (Signed)
Linda Moyer is a 38 y.o. G3P0020 at [redacted]w[redacted]d admitted for IUGR, AEDF, intermittent REDF. Admitted to antenatal on 10/17- transferred to L&D for delivery this morning at 33 weeks.  Subjective: Reports feeling moderate discomfort with contractions, requesting IV pain medication at this time. FOB present and supportive at bedside.  Objective: Vitals:   02/01/19 1852 02/01/19 1923 02/01/19 2044 02/01/19 2114  BP: 117/69     Pulse: 67     Resp: 18  18 16   Temp: 98.1 F (36.7 C) 98.3 F (36.8 C)    TempSrc: Oral Oral    SpO2:      Weight:      Height:        No intake/output data recorded. No intake/output data recorded.   FHT:  FHR: 150 bpm, variability: moderate,  accelerations:  Present,  decelerations:  Absent UC:   regular, every 1.5-3 minutes SVE:   Dilation: 4 Effacement (%): 50, 60 Station: -3 Exam by:: [employer]:  No results for input(s): WBC, HGB, HCT, PLT in the last 72 hours.  Assessment / Plan: 38 y.o. G3P0020 at [redacted]w[redacted]d here for IOL for IUGR, AEDF, intermittent REDF  Labor: Progressing normally, foley balloon in place. Will plan for AROM following foley balloon expulsion. Preeclampsia:  no signs or symptoms of toxicity and labs stable Fetal Wellbeing:  Category I Pain Control:  Planning IV pain medications Anticipated MOD:  NSVD  Juanna Cao, CNM, BSN 02/01/2019, 9:40 PM

## 2019-02-02 ENCOUNTER — Inpatient Hospital Stay (HOSPITAL_COMMUNITY): Payer: BLUE CROSS/BLUE SHIELD | Admitting: Anesthesiology

## 2019-02-02 ENCOUNTER — Encounter (HOSPITAL_COMMUNITY): Payer: Self-pay | Admitting: *Deleted

## 2019-02-02 DIAGNOSIS — O365930 Maternal care for other known or suspected poor fetal growth, third trimester, not applicable or unspecified: Secondary | ICD-10-CM

## 2019-02-02 DIAGNOSIS — Z3A33 33 weeks gestation of pregnancy: Secondary | ICD-10-CM

## 2019-02-02 MED ORDER — DIBUCAINE (PERIANAL) 1 % EX OINT: 1.0000 "application " | TOPICAL_OINTMENT | CUTANEOUS | Status: DC | PRN

## 2019-02-02 MED ORDER — PHENYLEPHRINE 40 MCG/ML (10ML) SYRINGE FOR IV PUSH (FOR BLOOD PRESSURE SUPPORT)
PREFILLED_SYRINGE | INTRAVENOUS | Status: AC
  Filled 2019-02-02: qty 10

## 2019-02-02 MED ORDER — DIPHENHYDRAMINE HCL 25 MG PO CAPS: 25.0000 mg | ORAL_CAPSULE | Freq: Four times a day (QID) | ORAL | Status: DC | PRN

## 2019-02-02 MED ORDER — WITCH HAZEL-GLYCERIN EX PADS: 1.0000 "application " | MEDICATED_PAD | CUTANEOUS | Status: DC | PRN

## 2019-02-02 MED ORDER — PRENATAL MULTIVITAMIN CH
1.0000 | ORAL_TABLET | Freq: Every day | ORAL | Status: DC
  Administered 2019-02-03 – 2019-02-04 (×2): 1 via ORAL
  Filled 2019-02-02 (×2): qty 1

## 2019-02-02 MED ORDER — DIPHENHYDRAMINE HCL 50 MG/ML IJ SOLN: 12.5000 mg | INTRAMUSCULAR | Status: DC | PRN

## 2019-02-02 MED ORDER — EPHEDRINE 5 MG/ML INJ: 10.0000 mg | INTRAVENOUS | Status: DC | PRN

## 2019-02-02 MED ORDER — OXYTOCIN 40 UNITS IN NORMAL SALINE INFUSION - SIMPLE MED
1.0000 m[IU]/min | INTRAVENOUS | Status: DC
  Administered 2019-02-02: 2 m[IU]/min via INTRAVENOUS
  Filled 2019-02-02: qty 1000

## 2019-02-02 MED ORDER — SIMETHICONE 80 MG PO CHEW: 80.0000 mg | CHEWABLE_TABLET | ORAL | Status: DC | PRN

## 2019-02-02 MED ORDER — LACTATED RINGERS AMNIOINFUSION
INTRAVENOUS | Status: DC
  Administered 2019-02-02: 13:00:00 via INTRAUTERINE

## 2019-02-02 MED ORDER — PHENYLEPHRINE 40 MCG/ML (10ML) SYRINGE FOR IV PUSH (FOR BLOOD PRESSURE SUPPORT): 80.0000 ug | PREFILLED_SYRINGE | INTRAVENOUS | Status: DC | PRN

## 2019-02-02 MED ORDER — IBUPROFEN 600 MG PO TABS
600.0000 mg | ORAL_TABLET | Freq: Four times a day (QID) | ORAL | Status: DC
  Administered 2019-02-02 – 2019-02-04 (×9): 600 mg via ORAL
  Filled 2019-02-02 (×9): qty 1

## 2019-02-02 MED ORDER — SENNOSIDES-DOCUSATE SODIUM 8.6-50 MG PO TABS
2.0000 | ORAL_TABLET | ORAL | Status: DC
  Administered 2019-02-02 – 2019-02-03 (×2): 2 via ORAL
  Filled 2019-02-02 (×2): qty 2

## 2019-02-02 MED ORDER — ZOLPIDEM TARTRATE 5 MG PO TABS: 5.0000 mg | ORAL_TABLET | Freq: Every evening | ORAL | Status: DC | PRN

## 2019-02-02 MED ORDER — LACTATED RINGERS IV SOLN: 500.0000 mL | Freq: Once | INTRAVENOUS | Status: DC

## 2019-02-02 MED ORDER — ONDANSETRON HCL 4 MG PO TABS: 4.0000 mg | ORAL_TABLET | ORAL | Status: DC | PRN

## 2019-02-02 MED ORDER — BENZOCAINE-MENTHOL 20-0.5 % EX AERO: 1.0000 "application " | INHALATION_SPRAY | CUTANEOUS | Status: DC | PRN

## 2019-02-02 MED ORDER — ACETAMINOPHEN 325 MG PO TABS: 650.0000 mg | ORAL_TABLET | ORAL | Status: DC | PRN

## 2019-02-02 MED ORDER — COCONUT OIL OIL: 1.0000 "application " | TOPICAL_OIL | Status: DC | PRN

## 2019-02-02 MED ORDER — FENTANYL-BUPIVACAINE-NACL 0.5-0.125-0.9 MG/250ML-% EP SOLN
EPIDURAL | Status: AC
  Filled 2019-02-02: qty 250

## 2019-02-02 MED ORDER — FENTANYL-BUPIVACAINE-NACL 0.5-0.125-0.9 MG/250ML-% EP SOLN: 12.0000 mL/h | EPIDURAL | Status: DC | PRN

## 2019-02-02 MED ORDER — ONDANSETRON HCL 4 MG/2ML IJ SOLN: 4.0000 mg | INTRAMUSCULAR | Status: DC | PRN

## 2019-02-02 MED ORDER — TETANUS-DIPHTH-ACELL PERTUSSIS 5-2.5-18.5 LF-MCG/0.5 IM SUSP: 0.5000 mL | Freq: Once | INTRAMUSCULAR | Status: DC

## 2019-02-02 NOTE — Progress Notes (Addendum)
Ctsp for recurrent variable decelerations and occasional late decelerations. IV fluid bolus administered by RN prior to contacting SNM.   FHT: Cat 2 - 150bpm baseline, moderate variability, accels present, occasional variables and decelerations   Ctx: Regular, every 1-4 minutes  SVE: 5/60/-2 by B. Roque Cash, RN  Stop pitocin at this time, restart in 1 hour 1x1. Continue frequent repositioning.  Juanna Cao, SNM, BSN 02/02/2019 2:50AM

## 2019-02-02 NOTE — Progress Notes (Signed)
Linda Moyer is a 38 y.o. G3P0020 at [redacted]w[redacted]d admitted for IUGR with AEDF and one episode of REDF.  Subjective: Patient reporting more discomfort with contractions. Coping well with focused breathing and rocking on birthing ball at bedside.  Objective: BP 121/81   Pulse (!) 59   Temp 98.9 F (37.2 C) (Oral)   Resp 18   Ht 5' 5.5" (1.664 m)   Wt 72.3 kg   LMP 06/15/2018   SpO2 100%   BMI 26.12 kg/m  No intake/output data recorded. No intake/output data recorded.  FHT:  FHR: 145 bpm, variability: moderate,  accelerations:  Present,  decelerations:  Present Variable UC:   regular, every 2-4 minutes SVE:   Dilation: 5 Effacement (%): 50 Station: -2 Exam by:: B [employer]: Lab Results  Component Value Date   WBC 18.5 (H) 02/01/2019   HGB 11.5 (L) 02/01/2019   HCT 34.5 (L) 02/01/2019   MCV 92.7 02/01/2019   PLT 195 02/01/2019    Assessment / Plan: --Category II tracing but significantly improved from previous --S/p Cytotec and foley balloon --Pitocin infusing at 5 milliunits, continue 1 x 1 PRN --S/p BMZ 08/31 and 09/01 then 10/17 and 10/18 --Due for SVE within the next hour --Anticipate NSVD  Darlina Rumpf, CNM 02/02/2019, 9:25 AM

## 2019-02-02 NOTE — Progress Notes (Signed)
Linda Moyer is a 38 y.o. G3P0020 at [redacted]w[redacted]d admitted for Progressing on Pitocin, will continue to increase then AROM and IUGR, AEDF, intermittent REDF. Admitted to antenatal on 10/17- transferred to L&D for delivery this morning at 33 weeks.  Subjective: Feeling comfortable at this time, denies any discomfort with ctx. FOB present and supportive at bedside.  Objective: Vitals:   02/02/19 0432 02/02/19 0511 02/02/19 0532 02/02/19 0602  BP: 116/63 (!) 122/59 128/71 (!) 124/56  Pulse: (!) 50 (!) 52 (!) 53 (!) 57  Resp: 18 16 16 16   Temp:  98.9 F (37.2 C)    TempSrc:  Oral    SpO2:      Weight:      Height:        No intake/output data recorded. No intake/output data recorded.   FHT:  FHR: 140 bpm, variability: moderate,  accelerations:  Present,  decelerations:  Present occasional variables UC:   regular, every 1.5-5 minutes SVE:   Dilation: 5 Effacement (%): 50 Station: -2 Exam by:: B [employer]:   Recent Labs    02/01/19 1452  WBC 18.5*  HGB 11.5*  HCT 34.5*  PLT 195    Assessment / Plan: 38 y.o. G3P0020 at [redacted]w[redacted]d here for IOL for IUGR, AEDF, intermittent REDF  Labor: IV Pitocin titration per protocol, plan for AROM. Continue frequent position changes for occasional variable decels. Preeclampsia:  no signs or symptoms of toxicity and labs stable Fetal Wellbeing:  Category II Pain Control:  IV pain meds Anticipated MOD:  NSVD  Juanna Cao, CNM, BSN 02/02/2019, 7:12 AM

## 2019-02-02 NOTE — Anesthesia Preprocedure Evaluation (Deleted)
Anesthesia Evaluation    Airway        Dental   Pulmonary           Cardiovascular      Neuro/Psych    GI/Hepatic   Endo/Other    Renal/GU      Musculoskeletal   Abdominal   Peds  Hematology   Anesthesia Other Findings   Reproductive/Obstetrics                            Anesthesia Physical  Anesthesia Plan  ASA:   Anesthesia Plan:    Post-op Pain Management:    Induction:   PONV Risk Score and Plan:   Airway Management Planned:   Additional Equipment:   Intra-op Plan:   Post-operative Plan:   Informed Consent:   Plan Discussed with:   Anesthesia Plan Comments:        Anesthesia Quick Evaluation  

## 2019-02-02 NOTE — Discharge Instructions (Signed)

## 2019-02-02 NOTE — Progress Notes (Addendum)
ROSAMARY BOUDREAU is a 38 y.o. G3P0020 at [redacted]w[redacted]d admitted for IUGR, AEDF, intermittent REDF. Admitted to antenatal on 10/17- transferred to L&D for delivery this morning at 33 weeks.  Subjective: Reports feeling comfortable at this time. Resting well through contractions. FOB present and supportive at bedside.  Objective: Vitals:   02/02/19 0111 02/02/19 0132 02/02/19 0204 02/02/19 0231  BP: 131/71 123/61 (!) 102/46 116/67  Pulse: 60 66 (!) 57 (!) 59  Resp: 18 16 16 16   Temp: 98.9 F (37.2 C)     TempSrc: Oral     SpO2:      Weight:      Height:        No intake/output data recorded. No intake/output data recorded.   FHT:  FHR: 150 bpm, variability: moderate,  accelerations:  Present,  decelerations:  Present occasional variable decels UC:   regular, every 1-3.5 minutes SVE:   Dilation: 5 Effacement (%): 60 Station: -2 Exam by:: B [employer]:   Recent Labs    02/01/19 1452  WBC 18.5*  HGB 11.5*  HCT 34.5*  PLT 195    Assessment / Plan: 38 y.o. G3P0020 at [redacted]w[redacted]d here for IOL for IUGR, AEDF, intermittent REDF  Labor: Progressing normally, foley balloon expelled spontaneously. Will plan for AROM. Preeclampsia:  no signs or symptoms of toxicity and labs stable Fetal Wellbeing:  Category II - occasional variable decelerations. IV fluid bolus administered by RN, will continue repositioning. Pain Control:  IV pain meds Anticipated MOD:  NSVD  Juanna Cao, CNM, BSN 02/02/2019, 2:40 AM

## 2019-02-02 NOTE — Progress Notes (Signed)
Patient showering unable to perform fundal check/ vitals at this time.

## 2019-02-02 NOTE — Discharge Summary (Signed)
Postpartum Discharge Summary     Patient Name: Linda Moyer DOB: [birth year] MRN: [id number]  Date of admission: 01/28/2019 Delivering Provider: Ulla Gallo B   Date of discharge: 02/04/2019  Admitting diagnosis: Preg Intrauterine pregnancy: [redacted]w[redacted]d    Secondary diagnosis:  Principal Problem:   Pregnancy affected by intrauterine growth retardation (IUGR) Active Problems:   IUGR (intrauterine growth restriction) affecting care of mother   Preterm delivery (maternal condition)  Additional problems: None     Discharge diagnosis: Preterm Pregnancy Delivered                                                                                                Post partum procedures:None  Augmentation: AROM, Pitocin, Cytotec and Foley Balloon  Complications: None  Hospital course:  Induction of Labor With Vaginal Delivery   38y.o. yo G3P0020 at 334w1das admitted to the hospital 01/28/2019 for induction of labor.  Indication for induction: IOL FGR, AEDF with intermittent REDF.  Patient had an uncomplicated labor course as follows: Membrane Rupture Time/Date: 1:05 PM ,02/02/2019   Intrapartum Procedures: Episiotomy: None [1]                                         Lacerations:  None [1]  Patient had delivery of a Viable infant.  Information for the patient's newborn:  HaKrimson, Mass[place]0[[id number]]Delivery Method: Vaginal, Spontaneous(Filed from Delivery Summary)    02/02/2019  Details of delivery can be found in separate delivery note.  Patient had a routine postpartum course. Patient is discharged home 02/04/19. Delivery time: 3:58 PM    Magnesium Sulfate received: No BMZ received: Yes Rhophylac:No MMR:No Transfusion:No  Physical exam  Vitals:   02/03/19 0830 02/03/19 1430 02/03/19 2021 02/04/19 0500  BP: 116/74 105/70 118/65 112/86  Pulse: (!) 57 78 72 65  Resp: '16 18 16   ' Temp: 97.9 F (36.6 C) 98 F (36.7 C) 97.6 F (36.4 C) (!) 97.5 F (36.4 C)  TempSrc: Oral  Oral Oral Oral  SpO2: 99% 100% 100% 100%  Weight:      Height:       General: alert, cooperative and no distress Lochia: appropriate Uterine Fundus: firm Incision: N/A DVT Evaluation: No evidence of DVT seen on physical exam. Labs: Lab Results  Component Value Date   WBC 22.1 (H) 02/03/2019   HGB 10.9 (L) 02/03/2019   HCT 32.9 (L) 02/03/2019   MCV 92.7 02/03/2019   PLT 196 02/03/2019   CMP Latest Ref Rng & Units 01/28/2019  BUN 6 - 20 mg/dL -  Creatinine 0.44 - 1.00 mg/dL 0.68  AST 15 - 41 U/L -    Discharge instruction: per After Visit Summary and "Baby and Me Booklet".  After visit meds:  Allergies as of 02/04/2019      Reactions   Pollen Extract Other (See Comments)      Medication List    STOP taking these medications   Blood Pressure Kit   cetirizine  10 MG tablet Commonly known as: [place] Supp Sm Misc     TAKE these medications   aspirin EC 81 MG tablet Take 1 tablet (81 mg total) by mouth daily. Take after 12 weeks for prevention of preeclampsia later in pregnancy   calcium carbonate 1250 (500 Ca) MG chewable tablet Commonly known as: OS-CAL Chew 1 tablet by mouth daily.   cholecalciferol 25 MCG (1000 UT) tablet Commonly known as: VITAMIN D3 Take 1,000 Units by mouth daily.   ferrous sulfate 325 (65 FE) MG tablet Take 325 mg by mouth daily with breakfast.   ibuprofen 600 MG tablet Commonly known as: ADVIL Take 1 tablet (600 mg total) by mouth every 6 (six) hours.   loratadine 10 MG tablet Commonly known as: CLARITIN Take 10 mg by mouth daily as needed for itching.   PNV PO Take by mouth.       Diet: No evidence of DVT seen on physical exam.  Activity: Advance as tolerated. Pelvic rest for 6 weeks.   Outpatient follow up:4 weeks Follow up Appt: Future Appointments  Date Time Provider [place]  03/06/2019  1:00 PM Constant, Vickii Chafe, MD [place] None   Follow up Visit: [place] Follow up.   Why: In 4-6 weeks in office Contact information: [address] [place] [address] [phone]         Message sent to clinic 02/02/19  Please schedule this patient for Postpartum visit in: 4 weeks with the following provider: Any provider For C/S patients schedule nurse incision check in weeks 2 weeks: no High risk pregnancy complicated by: FGR Delivery mode:  SVD Anticipated Birth Control:  other/unsure PP Procedures needed: Possible IUD or Nexplanon placement  Schedule Integrated [place] visit: no      Newborn Data: Live born female  Birth Weight:  1440 g, 3lb 2.8 oz APGAR: 9, 9  Newborn Delivery   Birth date/time: 02/02/2019 15:58:00 Delivery type: Vaginal, Spontaneous      Baby Feeding: Bottle and Breast Disposition:NICU   02/04/2019 Fatima Blank, CNM

## 2019-02-02 NOTE — Progress Notes (Addendum)
Linda Moyer is a 38 y.o. G3P0020 at [redacted]w[redacted]d   Subjective: Patient reporting more discomfort, continues to plan unmedicated delivery. Continues to desire BTL if operative delivery.  Objective: BP (!) 111/59   Pulse (!) 57   Temp (!) 97.4 F (36.3 C) (Axillary)   Resp 18   Ht 5' 5.5" (1.664 m)   Wt 72.3 kg   LMP 06/15/2018   SpO2 100%   BMI 26.12 kg/m  No intake/output data recorded. No intake/output data recorded.  FHT:  BPM 145, moderate variability, + accelerations, recurrent variable decelerations UC: every 4-5 minutes SVE:   Dilation: 6 Effacement (%): 70 Station: -1, 0 Exam by:: Rosine Abe, CNM  Labs: Lab Results  Component Value Date   WBC 18.5 (H) 02/01/2019   HGB 11.5 (L) 02/01/2019   HCT 34.5 (L) 02/01/2019   MCV 92.7 02/01/2019   PLT 195 02/01/2019    Assessment / Plan: 38 y.o. G3P0020 at [redacted]w[redacted]d here for IOL for IUGR, AEDF, and intermittent REDF.  Labor: Progressing normally. S/p AROM and Amnioinfusion. Increasing pit and continuing amnio.  Preeclampsia:  no signs or symptoms of toxicity and labs stable Fetal Wellbeing:  Category II - occasional variable decelerations which have improved with amio and position changes Pain Control:  IV pain meds Anticipated MOD:  NSVD   Lona Millard, MD, PGY1 Labor and Delivery Teaching Service 02/02/2019, 1:24 PM

## 2019-02-02 NOTE — Progress Notes (Signed)
Bedside shift report was completed between Katha Hamming, Therapist, sports and myself.  Brooke completed the handoff and added my name in the "responsible" tab.  When I attempted to "cosign", it only pulls up a previous shifts cosign that was not completed.  Tammi Klippel Denice Paradise was at the desk and I made her aware.  She suggested I place this note.

## 2019-02-03 ENCOUNTER — Encounter (HOSPITAL_COMMUNITY): Payer: Self-pay | Admitting: *Deleted

## 2019-02-03 ENCOUNTER — Ambulatory Visit (HOSPITAL_COMMUNITY): Payer: BLUE CROSS/BLUE SHIELD

## 2019-02-03 LAB — CBC
HCT: 32.9 % — ABNORMAL LOW (ref 36.0–46.0)
Hemoglobin: 10.9 g/dL — ABNORMAL LOW (ref 12.0–15.0)
MCH: 30.7 pg (ref 26.0–34.0)
MCHC: 33.1 g/dL (ref 30.0–36.0)
MCV: 92.7 fL (ref 80.0–100.0)
Platelets: 196 10*3/uL (ref 150–400)
RBC: 3.55 MIL/uL — ABNORMAL LOW (ref 3.87–5.11)
RDW: 13.3 % (ref 11.5–15.5)
WBC: 22.1 10*3/uL — ABNORMAL HIGH (ref 4.0–10.5)
nRBC: 0 % (ref 0.0–0.2)

## 2019-02-03 NOTE — Progress Notes (Signed)
POSTPARTUM PROGRESS NOTE  Post Partum Day 1  Subjective:  Linda Moyer is a 38 y.o. [id number] s/p SVD at [redacted]w[redacted]d.  She reports she is doing well. No acute events overnight. She denies any problems with ambulating, voiding or po intake. Denies nausea or vomiting.  Pain is well controlled.  Lochia is appropriate.  Objective: Blood pressure 116/74, pulse (!) 57, temperature 97.9 F (36.6 C), temperature source Oral, resp. rate 16, height 5' 5.5" (1.664 m), weight 72.3 kg, last menstrual period 06/15/2018, SpO2 99 %, unknown if currently breastfeeding.  Physical Exam:  General: alert, cooperative and no distress Chest: no respiratory distress Heart:regular rate, distal pulses intact Abdomen: soft, nontender,  Uterine Fundus: firm, appropriately tender DVT Evaluation: No calf swelling or tenderness Extremities: No LE edema Skin: warm, dry  Recent Labs    02/01/19 1452 02/03/19 0817  HGB 11.5* 10.9*  HCT 34.5* 32.9*    Assessment/Plan: Linda Moyer is a 38 y.o. Y0V3710 s/p SVD at [redacted]w[redacted]d   PPD#1 - Doing well  Routine postpartum care Contraception: OCPs Feeding: Breast Dispo: Plan for discharge PPD#2.   LOS: 6 days   Phill Myron, D.O. OB Fellow  02/03/2019, 9:23 AM

## 2019-02-04 MED ORDER — IBUPROFEN 600 MG PO TABS: 600.0000 mg | ORAL_TABLET | Freq: Four times a day (QID) | ORAL | 0 refills | Status: DC

## 2019-02-06 ENCOUNTER — Other Ambulatory Visit: Payer: Self-pay

## 2019-02-06 LAB — SURGICAL PATHOLOGY

## 2019-02-06 NOTE — Progress Notes (Signed)
error 

## 2019-02-06 NOTE — Clinical Social Work Maternal (Signed)
CLINICAL SOCIAL WORK MATERNAL/CHILD NOTE  Patient Details  Name: Linda Moyer MRN: [id number] Date of Birth: 03/01/1981  Date:  19-Apr-2018  Clinical Social Worker Initiating Note:  Laurey Arrow Date/Time: Initiated:  02/03/19/1930     Child's Name:  Reeves Forth   Biological Parents:  Mother, Father(FOB is Aletta Edouard 04/09/1977)   Need for Interpreter:  None   Reason for Referral:  Behavioral Health Concerns   Address:  [place] [address]    Phone number:  [phone] (home)     Additional phone number:   Household Members/Support Persons (HM/SP):   (MOB is reporting that she resides with her Mother.)   HM/SP Name Relationship DOB or Age  HM/SP -1        HM/SP -2        HM/SP -3        HM/SP -4        HM/SP -5        HM/SP -6        HM/SP -7        HM/SP -8          Natural Supports (not living in the home):  Extended Family, Parent, Spouse/significant other, Immediate Family, Friends(MOB reports that FOB's family will also provide support if needed.)   Professional Supports: None   Employment: Animator   Type of Work: Recruitment consultant for [employer]   Education:  [place] arranged:    Museum/gallery curator Resources:  Medicaid, Private Insurance(BCBS)   Other Resources:      Cultural/Religious Considerations Which May Impact Care:  None reported  Strengths:  Ability to meet basic needs , Home prepared for child (Peds list was provided and MOB and deciding)   Psychotropic Medications:         Pediatrician:       Pediatrician List:   [place]      Pediatrician Fax Number:    Risk Factors/Current Problems:  Mental Health Concerns    Cognitive State:  Able to Concentrate , Alert , Goal Oriented , Insightful , Linear Thinking    Mood/Affect:  Interested , Comfortable , Happy , Bright , Relaxed    CSW  Assessment: CSW met with MOB in couplet care room 326.  When CSW arrived infant was asleep in isolette and MOB was up observing infant.  MOB was very pleasant and open to meeting with CSW.  MOB was forthcoming and easy to engage.   CSW inquired about MOB's thoughts and feelings regarding infant's NICU admission. MOB shared that she is feeling "Good and happy that infant is doing well."  MOB shared her pregnancy story and reported feeling less anxious since giving birth and infant is doing well.    CSW asked about MOB's MH and MOB reported, "I have never been dx with depression however since I was in middle school I have had symptoms of depression." MOB also shared that during her pregnancy she felt nervous and afraid for her infant's health.  CSW validated and normalized MOB's thoughts and feelings.  CSW provided education regarding the baby blues period vs. perinatal mood disorders, discussed treatment and gave resources for mental health follow up if concerns arise.  CSW recommends self-evaluation during the postpartum time period using the New Mom Checklist from Postpartum Progress and encouraged MOB to contact a medical  professional if symptoms are noted at any time. MOB presented with insight and awareness and did not display any acute MH symptoms. CSW also assessed for safety and MOB denied SI, HI, and DV.  MOB reported that FOB is very supportive and they both are feeling prepared to parent.  MOB declined resources for outpatient counseling however agreed to contact CSW for resources if needed.   CSW provided review of Sudden Infant Death Syndrome (SIDS) precautions. MOB shared that she little to no information about SIDS and was attentive to CSW.  CSW asked appropriate questions and responded appropriately to infant's questions.   MOB reported having all essential items for infant.   CSW will continue to offer resources and supports to family while infant remains in NICU  CSW Plan/Description:   Psychosocial Support and Ongoing Assessment of Needs, Sudden Infant Death Syndrome (SIDS) Education, Perinatal Mood and Anxiety Disorder (PMADs) Education, Other Information/Referral to [employer], MSW, [employer] Clinical Social Work [phone]  Dimple Nanas, LCSW 02/06/2019, 9:35 AM

## 2019-02-07 ENCOUNTER — Ambulatory Visit: Payer: Self-pay

## 2019-02-07 NOTE — Lactation Note (Signed)
This note was copied from a baby's chart. Lactation Consultation Note  Patient Name: Boy Hollyanne Schloesser [id number] Date: 02/07/2019   NICU RN called to say that Mom's milk had come to volume and she was interested in beginning to pump.   Mom is a P1. Infant is 4 days old. Mom's milk came to volume 2-3 days ago. She had been using cold compresses & cabbage leaves to suppress her supply, but then decided to start pumping. At the beginning of the consult, Mom's breasts were very full/borderline engorged.   Size 24 flanges were used initially, but Mom found them uncomfortable. Mom was switched to size 21 flanges with good results. Mom understands that she may need to switch back to size 24 flanges if she needs more room around her nipple when pumping. Mom was able to pump a little more than 60 mL.  Mom was shown how to use DEBP & how to disassemble, clean, & reassemble parts. Mom knows to pump q2-3 h & not to allow herself to get too full. Mom has a Meno appt today. A WIC referral was faxed. Mom says she also has [facility] & may be able to get a pump through them.   Mom was provided a NICU booklet & I pointed out to her the pages on expected breast milk expression (which Mom exceeded) & milk storage.  Mom knows she can ask the NICU nurse to call us if she has any questions.    Matthias Hughs [facility] 02/07/2019, 8:35 AM

## 2019-02-13 ENCOUNTER — Encounter: Payer: BLUE CROSS/BLUE SHIELD | Admitting: Obstetrics and Gynecology

## 2019-02-13 ENCOUNTER — Telehealth: Payer: Self-pay | Admitting: *Deleted

## 2019-02-13 NOTE — Telephone Encounter (Signed)
Attempt to contact pt regarding FMLA papers in office.  LM on VM for pt to call office.

## 2019-02-14 ENCOUNTER — Ambulatory Visit: Payer: Self-pay

## 2019-02-14 NOTE — Lactation Note (Signed)
This note was copied from a baby's chart. Lactation Consultation Note  Patient Name: Linda Moyer [id number] Date: 02/14/2019 Reason for consult: Follow-up assessment;NICU baby Met mom in the NICU for feeding assist.  Mom states she is pumping 2-3 times per day and obtains 35 mls.  Discussed the importance of pumping 8 times per day to establish and maintain a good milk supply.  I explained to mom baby will most likely lick and learn.  Baby is sleeping and receiving gavage feeding.  Baby placed in football hold.  A few drops of milk expressed into baby's mouth.  He did not show cues or open mouth.  Mom reassured.  Maternal Data    Feeding Feeding Type: Breast Fed  LATCH Score Latch: Too sleepy or reluctant, no latch achieved, no sucking elicited.  Audible Swallowing: None  Type of Nipple: Everted at rest and after stimulation  Comfort (Breast/Nipple): Soft / non-tender  Hold (Positioning): Assistance needed to correctly position infant at breast and maintain latch.  LATCH Score: 5  Interventions    Lactation Tools Discussed/Used     Consult Status Consult Status: PRN    Ave Filter 02/14/2019, 2:18 PM

## 2019-02-16 ENCOUNTER — Telehealth: Payer: Self-pay | Admitting: *Deleted

## 2019-02-16 DIAGNOSIS — Z3493 Encounter for supervision of normal pregnancy, unspecified, third trimester: Secondary | ICD-10-CM

## 2019-02-16 NOTE — Telephone Encounter (Signed)
Pt made aware that FMLA forms for FOB are complete and at front desk for pick up.  Pt made aware that fees will need to be paid before forms are faxed.

## 2019-02-17 ENCOUNTER — Ambulatory Visit: Payer: Self-pay

## 2019-02-17 NOTE — Lactation Note (Signed)
This note was copied from a baby's chart. Lactation Consultation Note  Patient Name: Linda Moyer [id number] Date: 02/17/2019 Reason for consult: 1st time breastfeeding;Infant < 6lbs;NICU baby;Preterm <34wks  [phone]  F/U visit with mom per RN request. P1 mom delivered @ 33.1wks with corrected age of 61.2. Per RN, mom not pumping enough and slight tension between mom and NICU staff regarding use of room refrigerator.  Entered room to find mom at bedside. [place] asked how pumping/feeding is going and immediately mom states "I know I'm not doing it enough. Some days I pump something and days like today I don't get anything out." Mom reports leaving bedside during the day and "I don't have time to do it when I'm not here." Mom states she has a Medela pump from [facility] but her pieces are scattered and she "can't get it together with pumping." Mom c/o manual pump with plunger is awkward for her. Mom given Symphony Hand Pump and instructed in use. Asked mom about her desires for feeding upon discharge and mom states she does want to breastfeed at home. Reviewed importance of regular stimulation to mimic infant's feeding schedule in order to sustain supply for feeding at home. Encouraged mom to pump q3 hours, just as baby will feed at home. Reviewed milk supply established by supply and demand.  Mom asked what she can eat to increase her supply. Encouraged healthy diet with lots of water. Mom may include oatmeal products if desired.  Mom with questionable dedication to pumping and level of maturity and understanding of supply and demand of milk supply.   Interventions Interventions: DEBP;Expressed milk;Skin to skin  Lactation Tools Discussed/Used WIC Program: Yes Pump Review: Setup, frequency, and cleaning   Consult Status Consult Status: PRN    Cranston Neighbor 02/17/2019, 9:58 AM

## 2019-02-22 ENCOUNTER — Ambulatory Visit: Payer: Self-pay

## 2019-02-22 NOTE — Lactation Note (Signed)
This note was copied from a baby's chart. Lactation Consultation Note  Patient Name: Linda Moyer [id number] Date: 02/22/2019 Reason for consult: Follow-up assessment;Primapara;1st time breastfeeding;Mother's request;NICU baby;Infant < 6lbs;Preterm <47wks  63 weeks old pre-term NICU female < 5 lbs who is being partially BF and formula fed by his mother, she's a P1. Mom called for a feeding assist at 4:30 pm. She told LC she still hasn't called her insurance company Nurse, mental health) to order her personal pump, urged her to do so. She's going back to work tomorrow and was concerned about having to take her hand pump at work. She does have a Symphony DEBP from [facility] but she said it's too bulky to take her to work (she takes the bus).   She hasn't been pumping consistently "maybe" 2-3 times/day and she's getting about 70 ml combined per pumping session. Baby is also on Similac 24 calorie formula, he's having feedings of 41 ml. Stressed to mom the importance of consistent pumping, however this mom came as formula only, praised her for her efforts of working towards BF and providing EBM for her baby.   Offered assistance with latch and mom agreed to have baby STS. Baby had difficulty latching to the bare breast, mom requested to have him on cross cradle to the left breast first, but since baby was unable to latch, suggested the use of a NS # 20. Mom said baby didn't like it, but agreed to use it. Baby was able to latch with the NS with a few audible swallows noted upon breast compressions, but required constant stimulation for sucking and he'd also take some breaks on and off. A pool of colostrum was observed inside NS.  Dad was also present when baby was nursing and praised mom for her efforts and gave her reassurance; he was very supportive. Let mom know that both she and baby are still new at this and that probably he'll show more readiness next week, but that she should keep taking him to breast daily to get him  familiar with it, so he can "practice" at the breast. Reviewed supply and demand, normal newborn behavior and pumping schedule.  Feeding plan:  1. Encouraged mom to start pumping consistently, ideally every 3 hours; but if she can't commit to a full time pumping commitment, she'll try at least 4-6 times/24 hours. 2. She'll call [employer] to get her personal DEBP that she can take to work 3. She'll continue taking baby to breast on cues or on schedule feedings, using NS # 20 PRN  Parents reported all questions and concerns were answered, they're both aware of [facility] OP services and will call PRN.   Maternal Data    Feeding Feeding Type: Breast Fed  LATCH Score Latch: Repeated attempts needed to sustain latch, nipple held in mouth throughout feeding, stimulation needed to elicit sucking reflex.(with NS # 20)  Audible Swallowing: A few with stimulation  Type of Nipple: Everted at rest and after stimulation(very short shafted though, needed a NS)  Comfort (Breast/Nipple): Soft / non-tender  Hold (Positioning): Assistance needed to correctly position infant at breast and maintain latch.  LATCH Score: 7  Interventions Interventions: Breast feeding basics reviewed;Assisted with latch;Breast massage;Breast compression;Adjust position;Support pillows;Hand express  Lactation Tools Discussed/Used Tools: Nipple Shields Nipple shield size: 20   Consult Status Consult Status: PRN    Less [place] S [place] Calix 02/22/2019, 5:17 PM

## 2019-03-06 ENCOUNTER — Encounter: Payer: Self-pay | Admitting: Obstetrics and Gynecology

## 2019-03-06 ENCOUNTER — Telehealth: Payer: Self-pay | Admitting: Obstetrics and Gynecology

## 2019-03-06 ENCOUNTER — Telehealth: Payer: BLUE CROSS/BLUE SHIELD | Admitting: Obstetrics and Gynecology

## 2019-03-06 ENCOUNTER — Ambulatory Visit: Payer: Medicaid Other | Admitting: Obstetrics and Gynecology

## 2019-03-06 MED ORDER — NORETHINDRONE 0.35 MG PO TABS: 1.0000 | ORAL_TABLET | Freq: Every day | ORAL | 11 refills | Status: DC

## 2019-03-06 NOTE — Telephone Encounter (Signed)
Returning call to patient.  Linda Moyer left a voicemail that she needed help.  She had previously cancelled her postpartum for today.    Her son is in the NICU at [facility] and the staff in the NICU have been very difficult.  She had an incident today where her mask slipped below her nose and she stated the staff were very upset with her and asked her to leave.    Linda Moyer is requesting that her son be transferred to [facility] NICU.    In response to Linda Moyer asking for HELP, patient did deny any feeling to harm herself.   I have rescheduled her virtual PP visit for this afternoon.  Encouraged Linda Moyer to discuss the desired transfer with the neonatologist on call today.

## 2019-03-06 NOTE — Progress Notes (Signed)
TELEHEALTH POSTPARTUM VIRTUAL VIDEO VISIT ENCOUNTER NOTE   Provider location: Center for [employer] at [place]   I connected with [name] on 03/06/19 at  1:00 PM EST by MyChart Video Encounter at home and verified that I am speaking with the correct person using two identifiers.    I discussed the limitations, risks, security and privacy concerns of performing an evaluation and management service virtually and the availability of in person appointments. I also discussed with the patient that there may be a patient responsible charge related to this service. The patient expressed understanding and agreed to proceed.  Chief Complaint: Postpartum Visit  History of Present Illness:  Linda Moyer is a 38 y.o. [id number] female who presents for a postpartum visit. She is 4 weeks postpartum following a spontaneous vaginal delivery. I have fully reviewed the prenatal and intrapartum course. The delivery was at 33 gestational weeks.  Anesthesia: none. Postpartum course has been unremarkable. Baby's course has been NICU. Baby is feeding by both breast and bottle - Similac. Bleeding staining only. Bowel function is normal. Bladder function is normal. Patient is not sexually active. Contraception method is none. Postpartum depression screening:neg   Last pap smear done 10/12/2018 and was Normal  Patient Active Problem List   Diagnosis Date Noted  . Preterm delivery (maternal condition) 02/04/2019  . IUGR (intrauterine growth restriction) affecting care of mother 01/28/2019  . Pregnancy affected by intrauterine growth retardation (IUGR) 01/27/2019  . Pruritus 01/27/2019  . AMA (advanced maternal age) multigravida 35+ 10/12/2018  . Supervision of high risk pregnancy, antepartum 09/27/2018    Medications Linda Moyer had no medications administered during this visit. Current Outpatient Medications  Medication Sig Dispense Refill  . aspirin EC 81 MG tablet Take 1 tablet (81 mg  total) by mouth daily. Take after 12 weeks for prevention of preeclampsia later in pregnancy 300 tablet 2  . calcium carbonate (OS-CAL) 1250 (500 Ca) MG chewable tablet Chew 1 tablet by mouth daily.    . cholecalciferol (VITAMIN D3) 25 MCG (1000 UT) tablet Take 1,000 Units by mouth daily.    . ferrous sulfate 325 (65 FE) MG tablet Take 325 mg by mouth daily with breakfast.    . ibuprofen (ADVIL) 600 MG tablet Take 1 tablet (600 mg total) by mouth every 6 (six) hours. 30 tablet 0  . loratadine (CLARITIN) 10 MG tablet Take 10 mg by mouth daily as needed for itching.    . Prenatal Vit w/Fe-Methylfol-FA (PNV PO) Take by mouth.     No current facility-administered medications for this visit.     Allergies Pollen extract  Physical Exam:  LMP 06/15/2018   Breastfeeding Yes   General:  Alert, oriented and cooperative. Patient is in no acute distress.  Mental Status: Normal mood and affect. Normal behavior. Normal judgment and thought content.   Respiratory: Normal respiratory effort noted, no problems with respiration noted  Rest of physical exam deferred due to type of encounter  PP Depression Screening:   Edinburgh Postnatal Depression Scale Screening Tool 03/06/2019 02/02/2019  I have been able to laugh and see the funny side of things. 0 0  I have looked forward with enjoyment to things. 1 1  I have blamed myself unnecessarily when things went wrong. 0 0  I have been anxious or worried for no good reason. 0 0  I have felt scared or panicky for no good reason. 1 0  Things have been getting on top of  me. 1 2  I have been so unhappy that I have had difficulty sleeping. 0 2  I have felt sad or miserable. 0 1  I have been so unhappy that I have been crying. 1 1  The thought of harming myself has occurred to me. 0 0  Edinburgh Postnatal Depression Scale Total 4 7     Assessment:Patient is a 38 y.o. [id number] who is 4 weeks postpartum from a normal spontaneous vaginal delivery.  She is doing  well.   Plan: Patient is planning to use progesterone only pills for contraception Discussed ways to increase milk production Patient is medically cleared to resume all activities of daily living RTC in 1 year for annual exam or prn   I discussed the assessment and treatment plan with the patient. The patient was provided an opportunity to ask questions and all were answered. The patient agreed with the plan and demonstrated an understanding of the instructions.   The patient was advised to call back or seek an in-person evaluation/go to the ED for any concerning postpartum symptoms.  I provided 17 minutes of face-to-face time during this encounter.   Catalina Antigua, MD Center for [employer], [facility]

## 2019-03-23 ENCOUNTER — Telehealth: Payer: Self-pay

## 2019-03-23 NOTE — Telephone Encounter (Signed)
ACZYSAY with [facility] called to advise [place] that this patients EPDS score today during a virtual visit was a 10. Pt denies thoughts of harming self or others and reports she is just stressed due to the baby having some complications and had to be in the NICU. Shelby Dubin reports that she gave the pt a referral for [facility] and states that the patient is going to reach out to them.

## 2019-05-31 ENCOUNTER — Other Ambulatory Visit: Payer: Self-pay | Admitting: Obstetrics and Gynecology

## 2019-05-31 MED ORDER — BALCOLTRA 0.1-20 MG-MCG(21) PO TABS: 1.0000 | ORAL_TABLET | Freq: Every day | ORAL | 4 refills | Status: DC

## 2019-06-05 ENCOUNTER — Other Ambulatory Visit: Payer: Self-pay | Admitting: Obstetrics and Gynecology

## 2019-06-05 MED ORDER — NORGESTIM-ETH ESTRAD TRIPHASIC 0.18/0.215/0.25 MG-25 MCG PO TABS: 1.0000 | ORAL_TABLET | Freq: Every day | ORAL | 4 refills | Status: DC

## 2019-06-12 DIAGNOSIS — N898 Other specified noninflammatory disorders of vagina: Secondary | ICD-10-CM

## 2019-06-13 MED ORDER — FLUCONAZOLE 150 MG PO TABS: 150.0000 mg | ORAL_TABLET | Freq: Once | ORAL | 0 refills | Status: AC

## 2019-06-15 ENCOUNTER — Other Ambulatory Visit (HOSPITAL_COMMUNITY)
Admission: RE | Admit: 2019-06-15 | Discharge: 2019-06-15 | Disposition: A | Discharge status: Home / Self Care | Payer: 59 | Source: Ambulatory Visit | Attending: Physician Assistant | Admitting: Physician Assistant

## 2019-06-15 DIAGNOSIS — Z124 Encounter for screening for malignant neoplasm of cervix: Secondary | ICD-10-CM | POA: Insufficient documentation

## 2019-06-19 LAB — CYTOLOGY - PAP: Diagnosis: NEGATIVE

## 2020-01-23 ENCOUNTER — Other Ambulatory Visit: Payer: Self-pay

## 2020-01-23 ENCOUNTER — Other Ambulatory Visit: Payer: 59

## 2020-01-23 DIAGNOSIS — Z111 Encounter for screening for respiratory tuberculosis: Secondary | ICD-10-CM | POA: Diagnosis not present

## 2020-01-25 ENCOUNTER — Other Ambulatory Visit: Payer: 59

## 2020-01-25 LAB — TB SKIN TEST
Induration: 0 mm
TB Skin Test: NEGATIVE

## 2020-01-25 NOTE — Progress Notes (Signed)
Patient presented for reading with no concerns.

## 2020-04-07 IMAGING — US US MFM OB LIMITED
1 series · 14 of 16 positions shown · non-contrast
Comparison: none

[Series 1: us mfm ob limited · 14 of 16 slices shown]
[im 1/16]
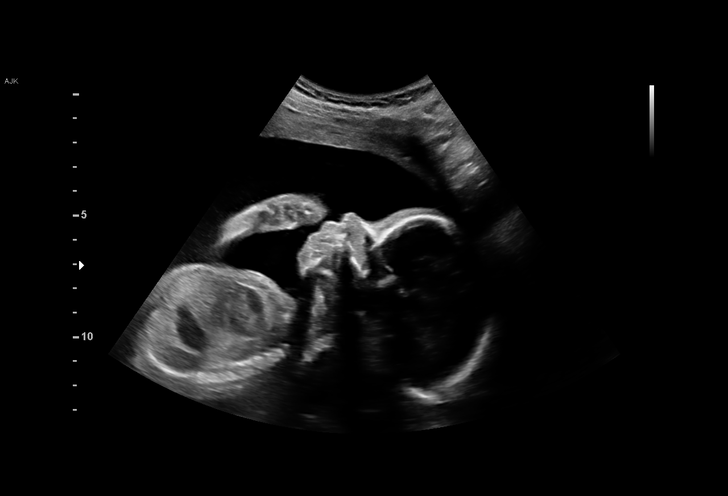
[im 2/16]
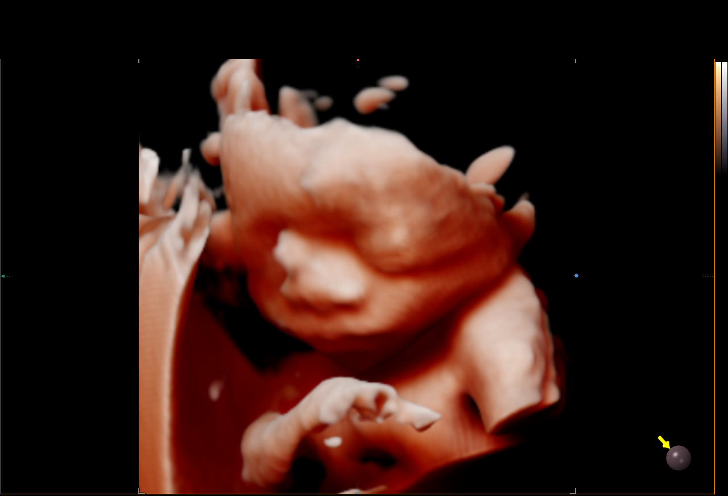
[im 3/16]
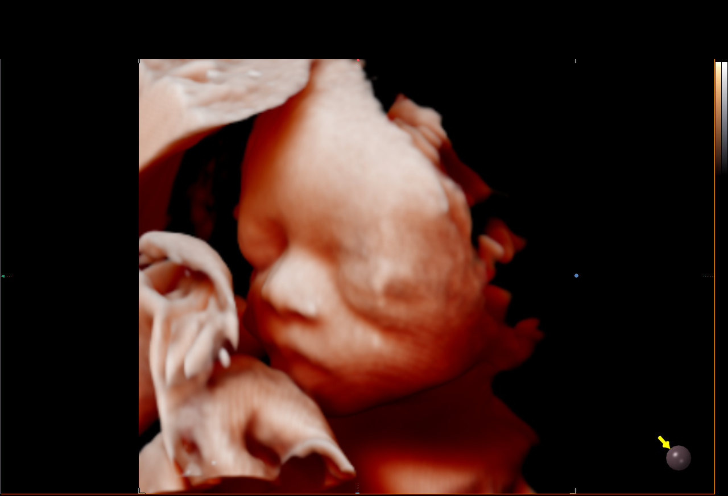
[im 5/16]
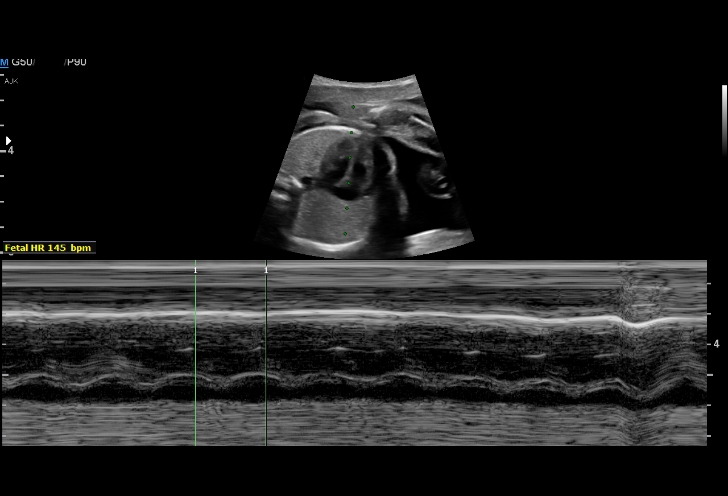
[im 6/16]
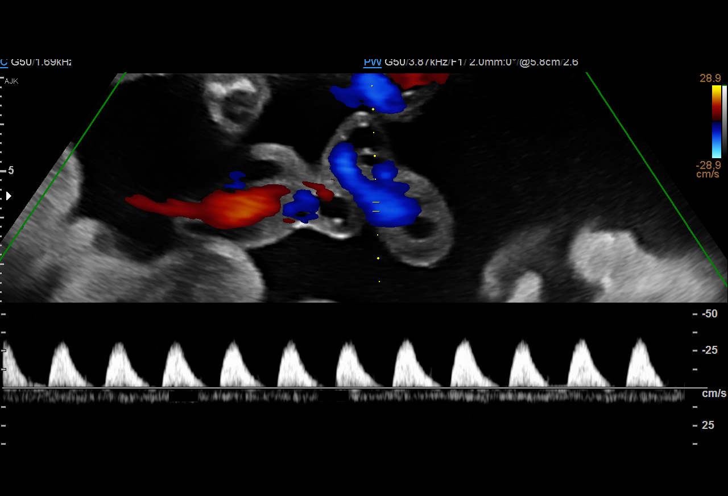
[im 7/16]
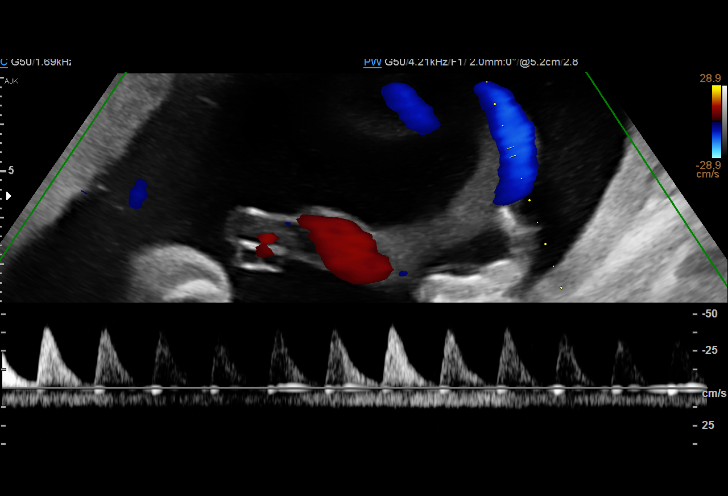
[im 8/16]
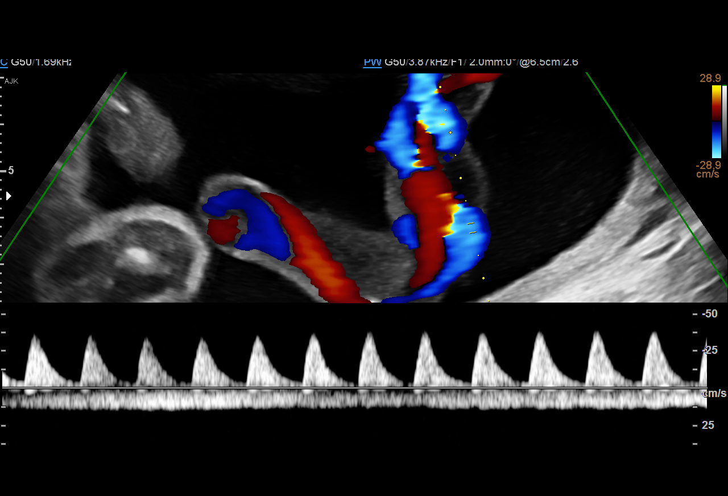
[im 9/16]
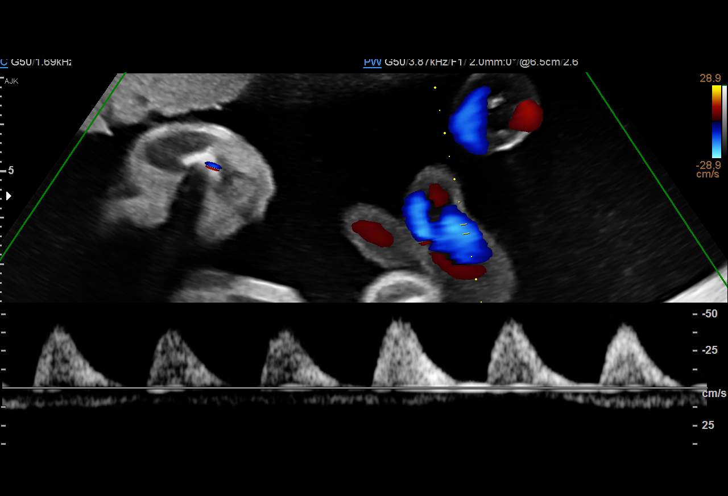
[im 10/16]
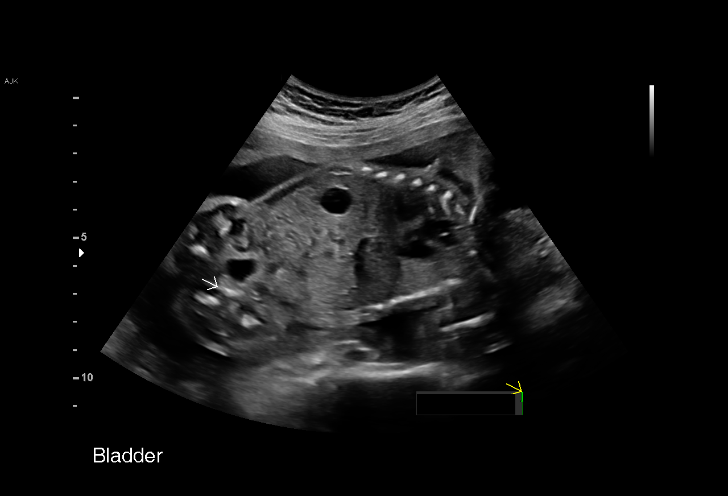
[im 11/16]
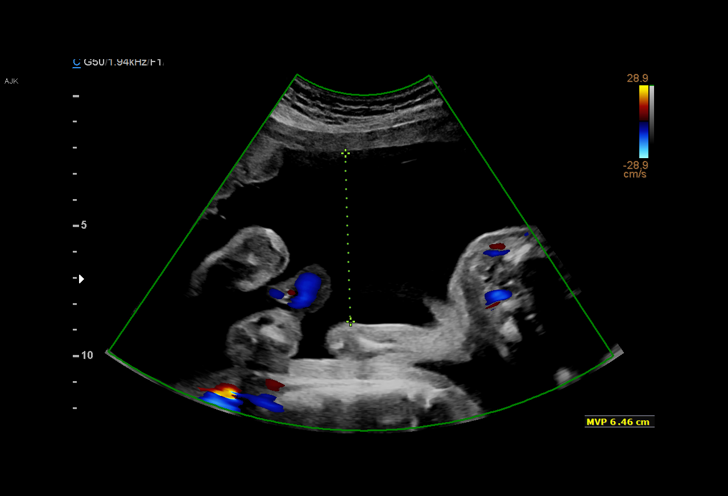
[im 13/16]
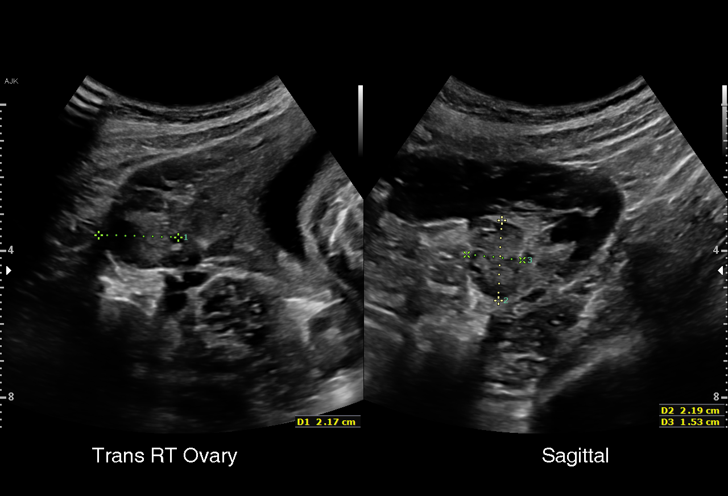
[im 14/16]
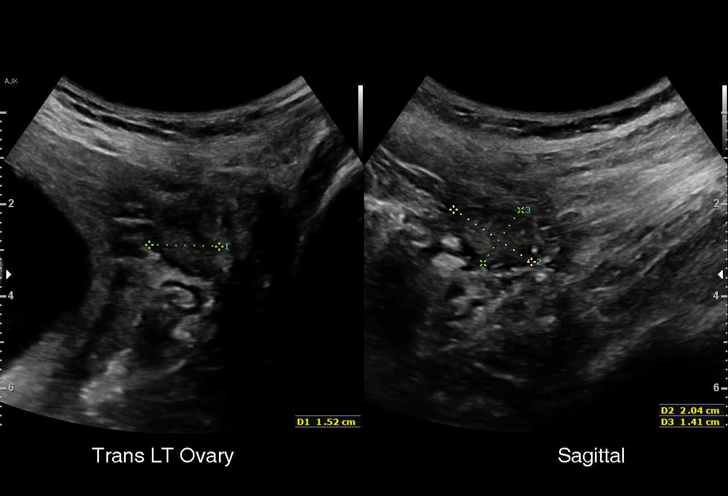
[im 15/16]
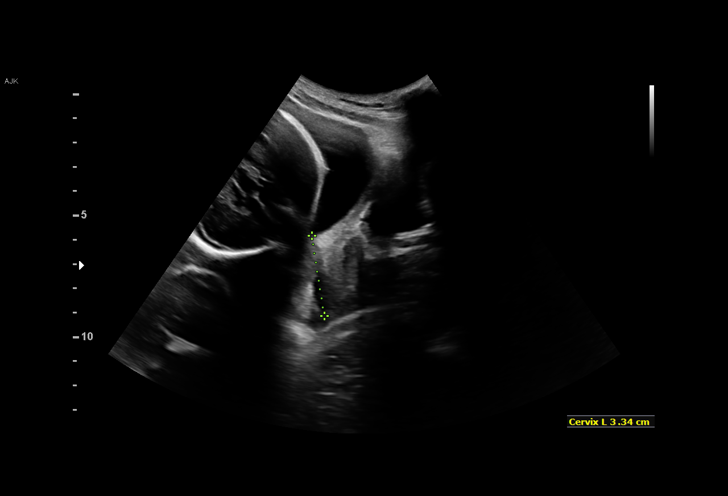
[im 16/16]
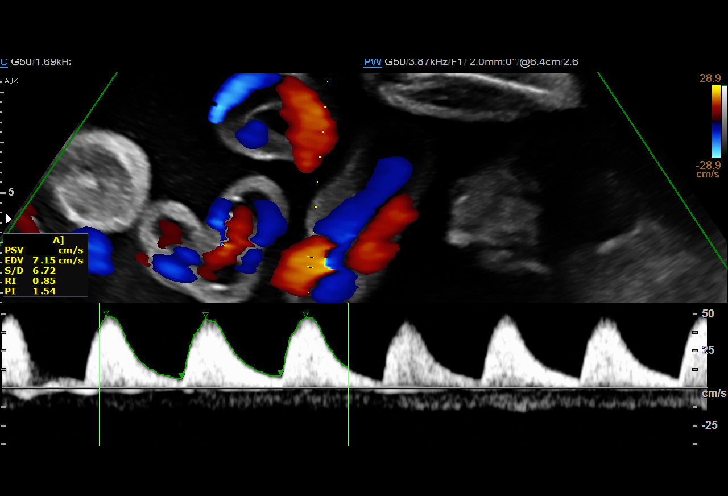

[14 of 16 positions shown; findings below may reference images not displayed]

----------------------------------------------------------------------

 ----------------------------------------------------------------------
Indications

  Small for gestational age fetus affecting
  management of mother
  Advanced maternal age multigravida 35+,
  second trimester (low risk NIPS)
  Polyhydramnios, second trimester
  25 weeks gestation of pregnancy
 ----------------------------------------------------------------------
Vital Signs

                                                Height:        5'6"
Fetal Evaluation

 Num Of Fetuses:         1
 Fetal Heart Rate(bpm):  145
 Cardiac Activity:       Observed
 Presentation:           Cephalic
 Placenta:               Anterior
 P. Cord Insertion:      Visualized, central

 Amniotic Fluid
 AFI FV:      Within normal limits

                             Largest Pocket(cm)


 Comment:    Bladder, stomach, diaphragm seen.
OB History

 Gravidity:    3         Term:   0        Prem:   0        SAB:   0
 TOP:          1       Ectopic:  1        Living: 0
Gestational Age

 LMP:           25w 2d        Date:  06/15/18                 EDD:   03/22/19
 Best:          25w 2d     Det. By:  LMP  (06/15/18)          EDD:   03/22/19
Doppler - Fetal Vessels

 Umbilical Artery
  S/D     %tile     RI                                     ADFV    RDFV
 6.72    > 97.5  0.85                                        Yes      No

Cervix Uterus Adnexa

 Cervix
 Length:           3.34  cm.
 Normal appearance by transabdominal scan.

 Uterus
 No abnormality visualized.

 Left Ovary
 Within normal limits.

 Right Ovary
 Within normal limits.

 Adnexa
 No abnormality visualized.
Impression

 Known IUGR
 Declined amniocentesis
 Today UA Dopplers demonstrating persistent absent diastolic
 flow. There was good fetal movement and amniotic fluid.
 I explained that this finding suggest worsening placental
 function. We discussed an increased risk for preterm delivery
 and inpatient admission.
 I recommend increasing testing to 2x weekly. [place]
 next week. [REDACTED] or [REDACTED].
Recommendations

 Follow up UA dopplers next week [REDACTED].
 Initiate 2x weekly testing
 [place] next week [REDACTED] and [REDACTED]
 Repeat growth in 2 week

## 2020-04-11 IMAGING — US US MFM UA CORD DOPPLER
1 series · 9 of 9 positions shown · non-contrast
Comparison: none

[Series 1: us mfm ua cord doppler · 9 of 9 slices shown]
[im 1/9]
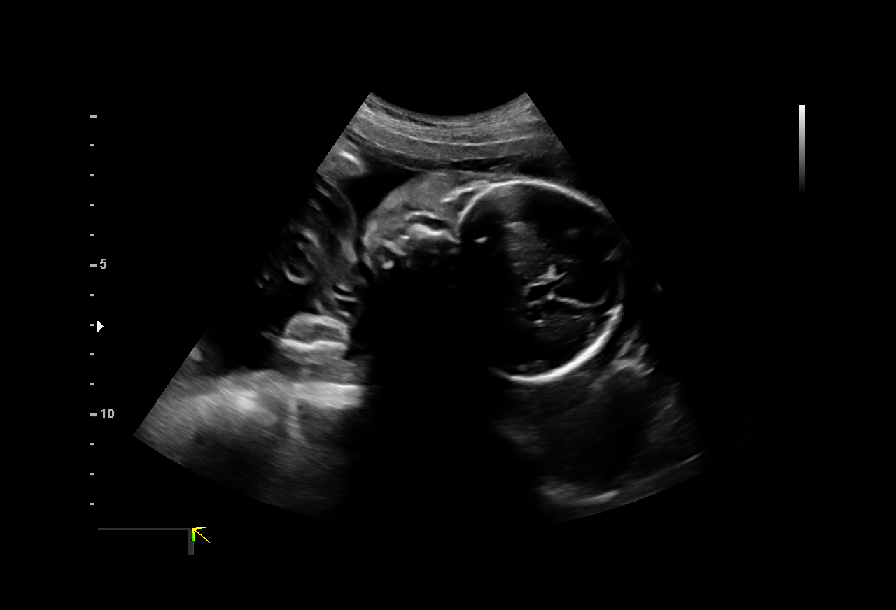
[im 2/9]
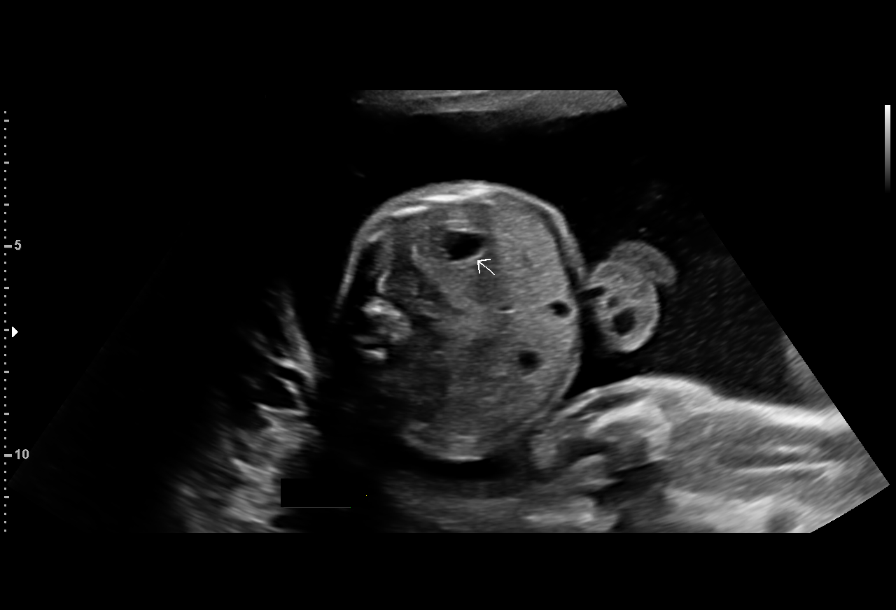
[im 3/9]
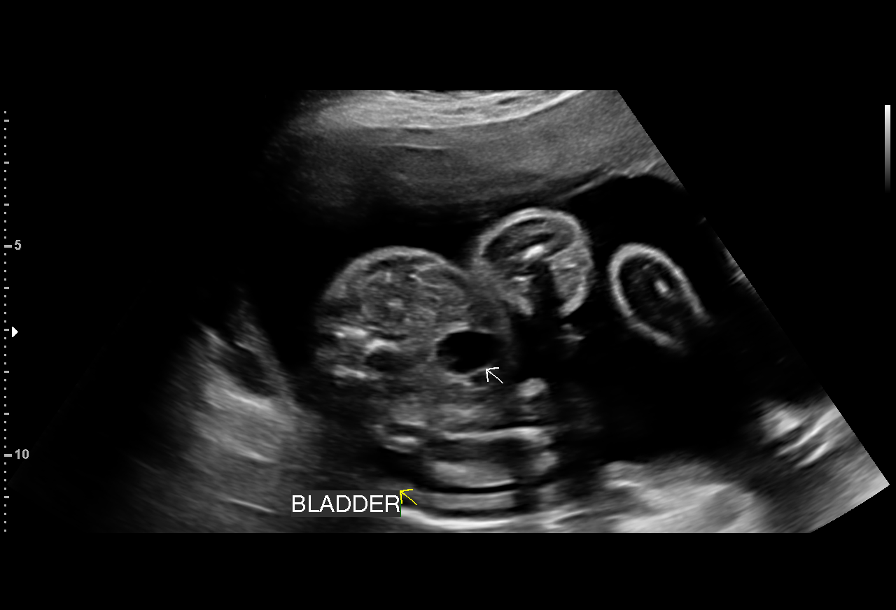
[im 4/9]
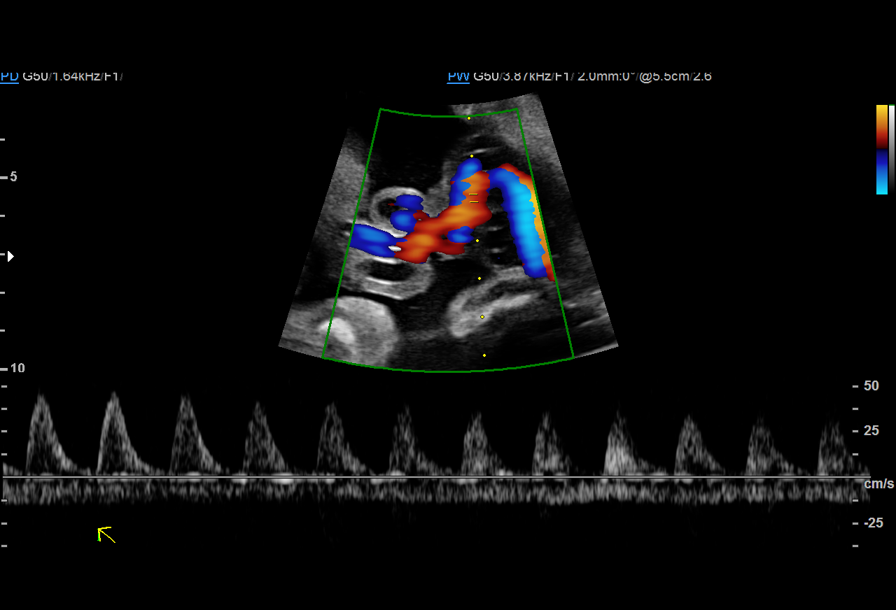
[im 5/9]
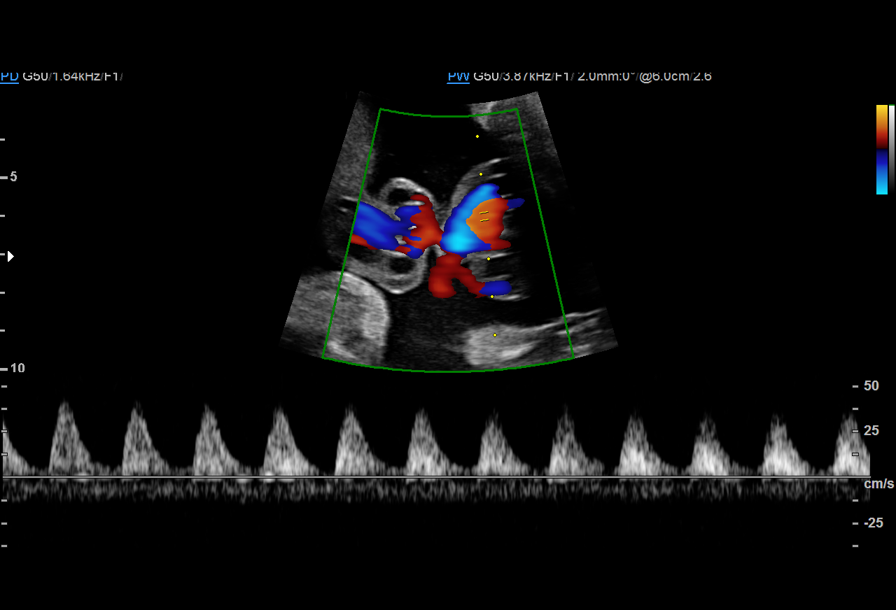
[im 6/9]
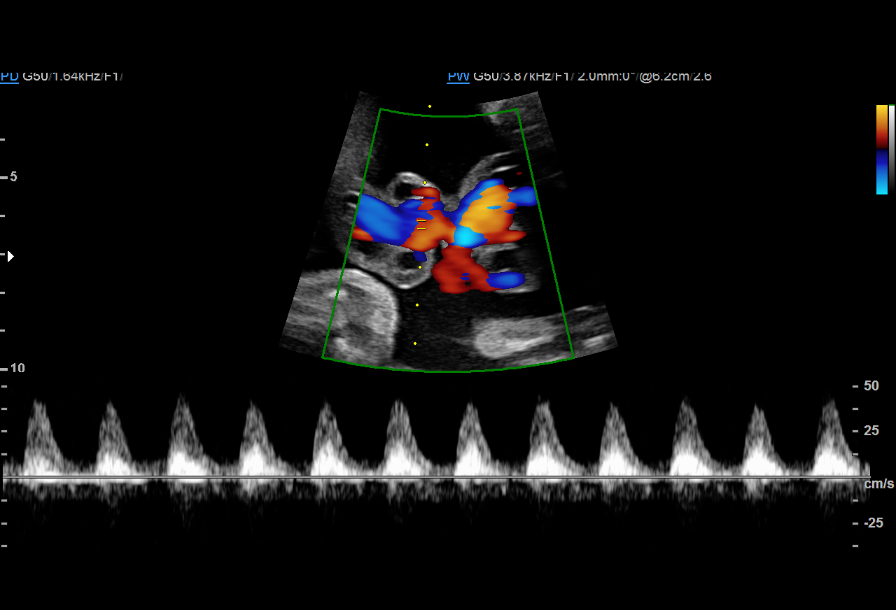
[im 7/9]
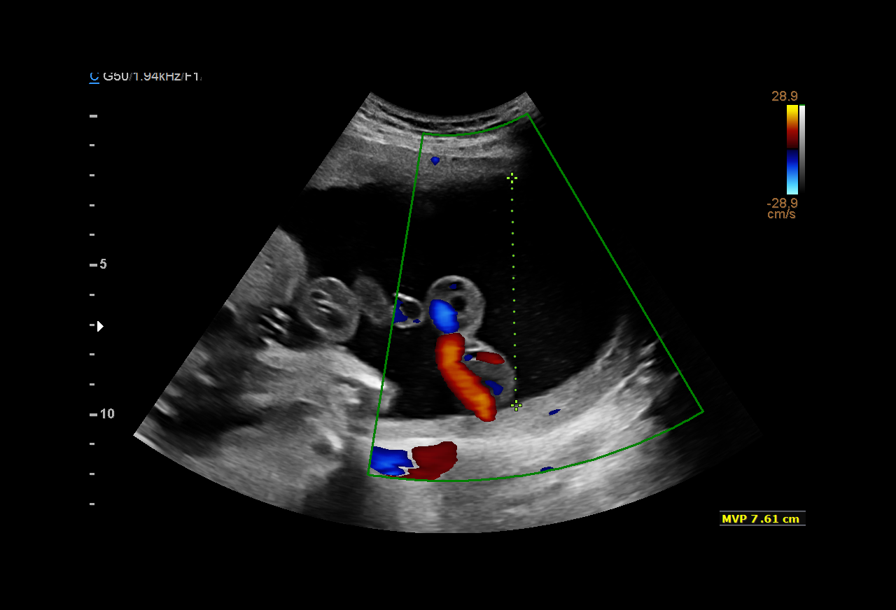
[im 8/9]
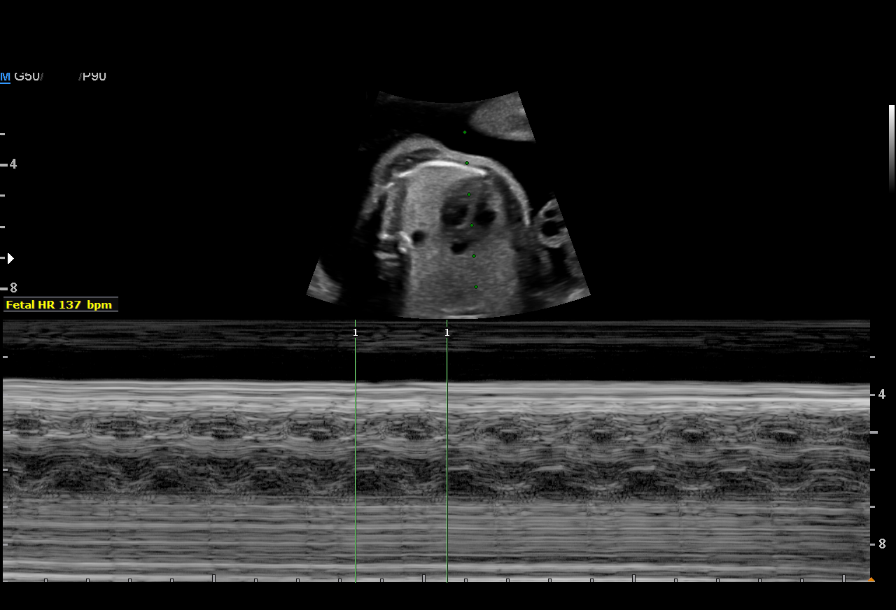
[im 9/9]
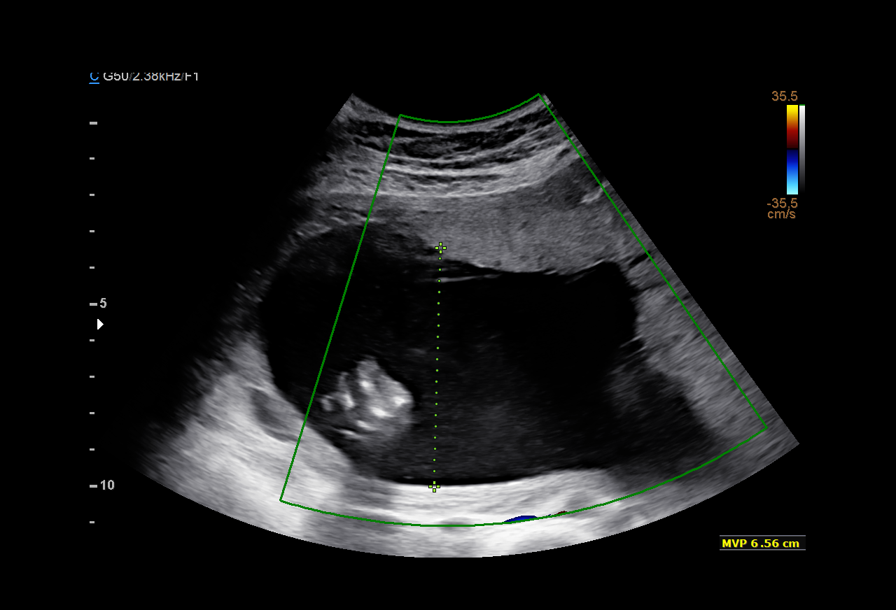

[9 of 9 positions shown; findings below may reference images not displayed]

1  US MFM UA CORD DOPPLER               76820.02     ROSENBLOOM
                                                       ELIOT
  2  US MFM OB LIMITED                    76815.01     ROSENBLOOM
                                                       ELIOT
 ----------------------------------------------------------------------

 ----------------------------------------------------------------------
Indications

  25 weeks gestation of pregnancy
  Small for gestational age fetus affecting
  management of mother
  Advanced maternal age multigravida 35+,
  second trimester (low risk NIPS)
  Polyhydramnios, second trimester
 ----------------------------------------------------------------------
Vital Signs

                                                Height:        5'6"
Fetal Evaluation

 Num Of Fetuses:          1
 Fetal Heart Rate(bpm):   137
 Cardiac Activity:        Observed
 Presentation:            Cephalic

 Amniotic Fluid
 AFI FV:      Within normal limits

                             Largest Pocket(cm)

OB History

 Gravidity:    3         Term:   0        Prem:   0        SAB:   0
 TOP:          1       Ectopic:  1        Living: 0
Gestational Age

 LMP:           25w 6d        Date:  06/15/18                 EDD:   03/22/19
 Best:          25w 6d     Det. By:  LMP  (06/15/18)          EDD:   03/22/19
Anatomy

 Thoracic:              Appears normal         Bladder:                Appears normal
 Stomach:               Appears normal, left
                        sided
Doppler - Fetal Vessels

 Umbilical Artery
                                                           ADFV    RDFV
                                                             Yes      No

Cervix Uterus Adnexa

 Cervix
 Not visualized (advanced GA >90wks)
Impression

 Known IUGR
 Absent EDF some intermittent ADEF
 Second Antetse given today.

 We discussed today's findings of continued intermittement
 AEDF and persistent ADEF. She feels fetal movement and
 there is good amniotic fluid. We discussed increased risk for
 preterm delivery and inpatient management. For now we plan
 for twice weekly UA Dopplers (repeat on [REDACTED]).
Recommendations

 Follow up UA Dopplers on [REDACTED].
 Delivery by 34 weeks if AEDF persist.
 Initiate NST next week.

## 2020-04-28 IMAGING — US US MFM UA CORD DOPPLER
1 series · 14 of 28 positions shown · non-contrast
Comparison: none

[Series 1: us mfm ua cord doppler · 14 of 32 slices shown]
[im 2/32]
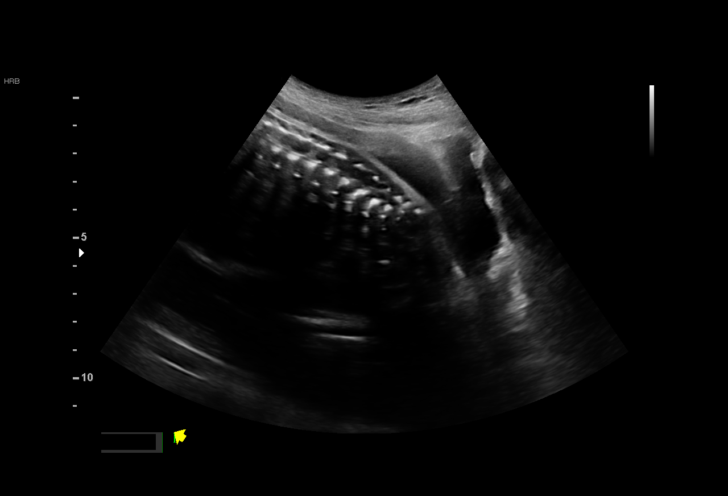
[im 4/32]
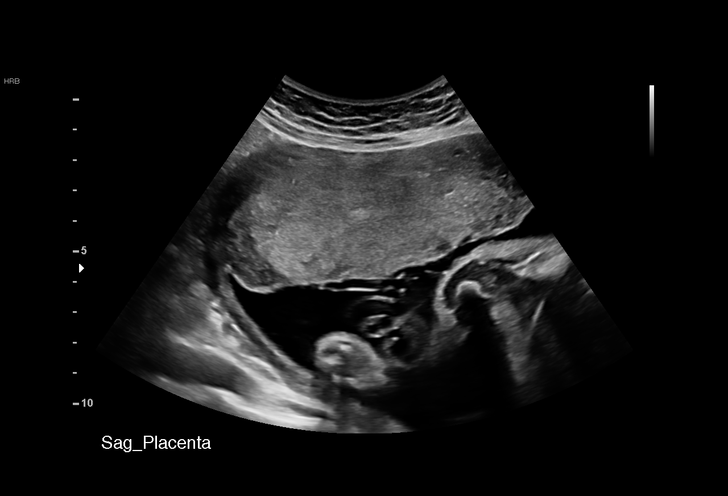
[im 6/32]
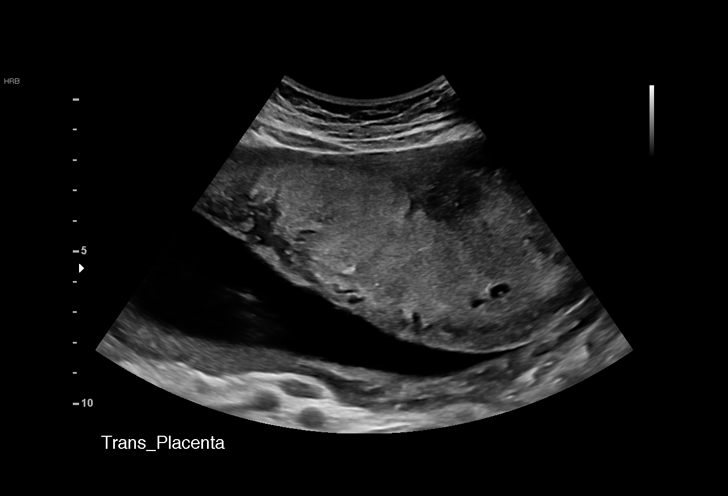
[im 9/32]
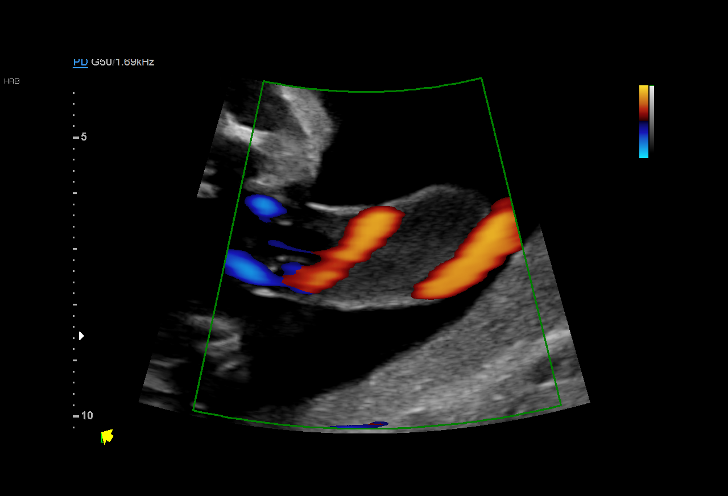
[im 11/32]
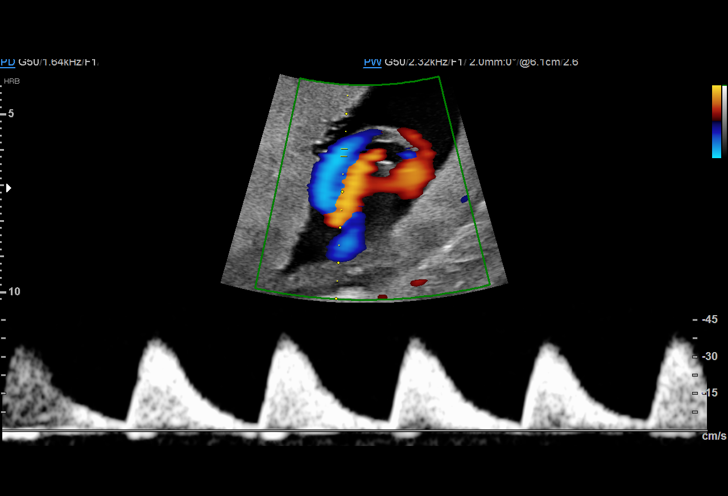
[im 13/32]
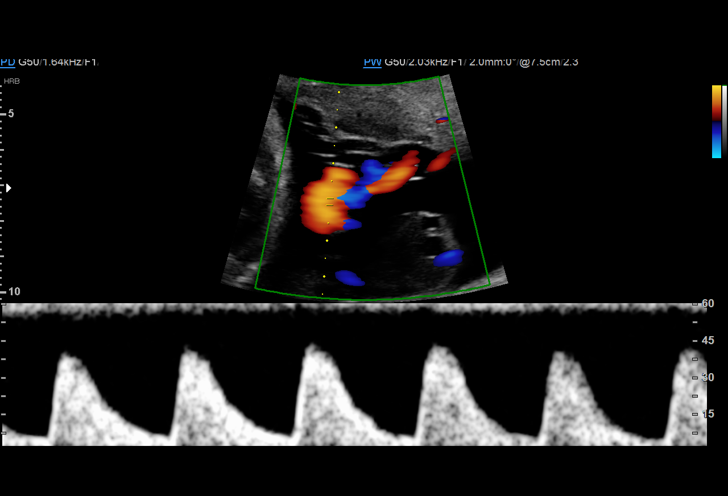
[im 15/32]
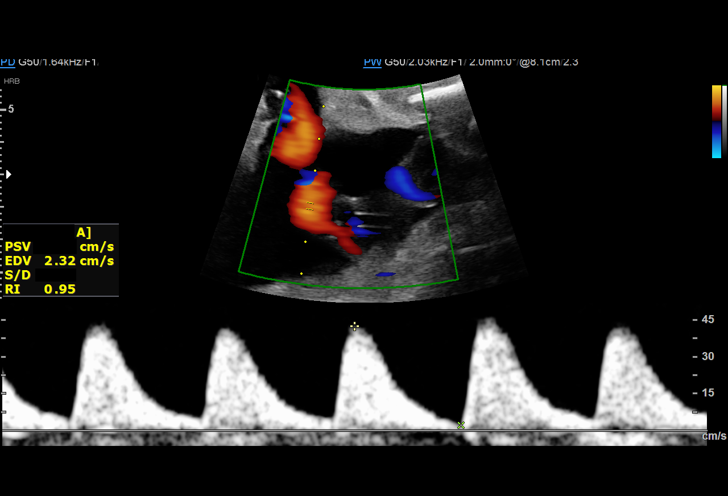
[im 18/32]
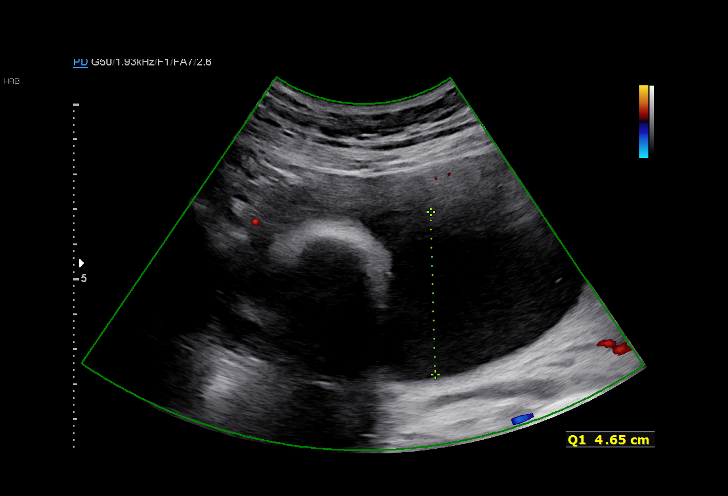
[im 20/32]
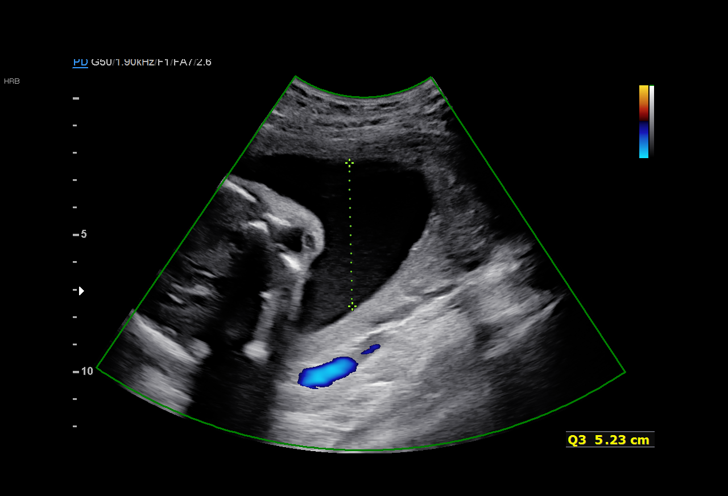
[im 22/32]
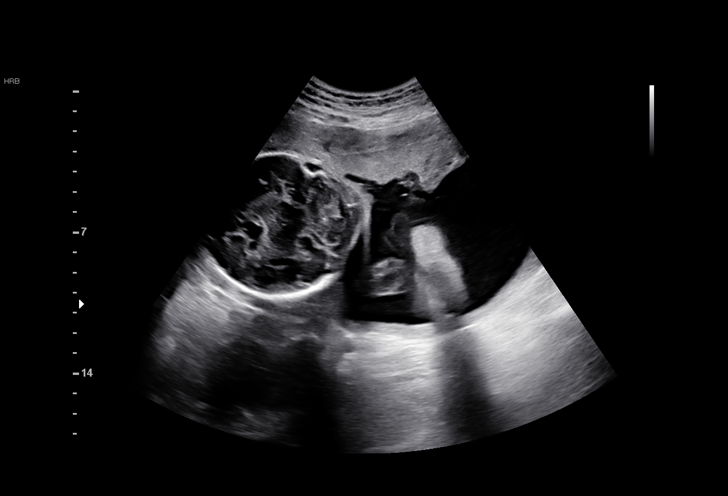
[im 25/32]
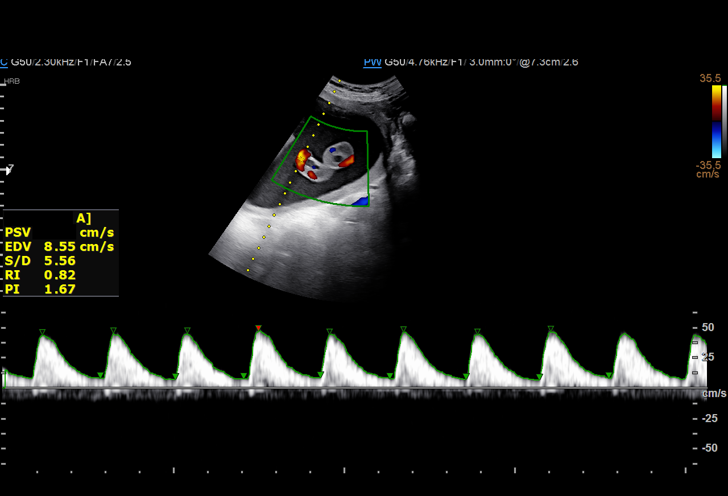
[im 27/32]
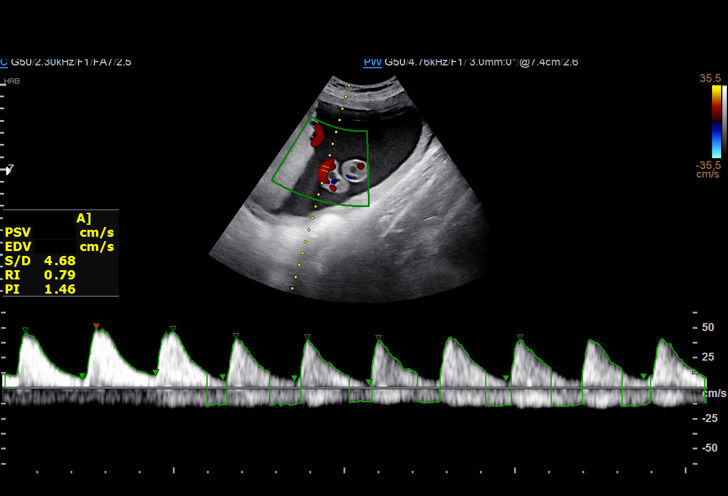
[im 29/32]
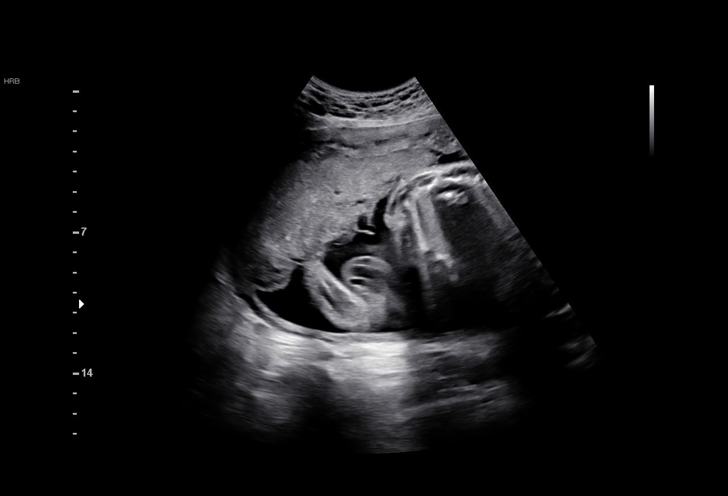
[im 32/32]
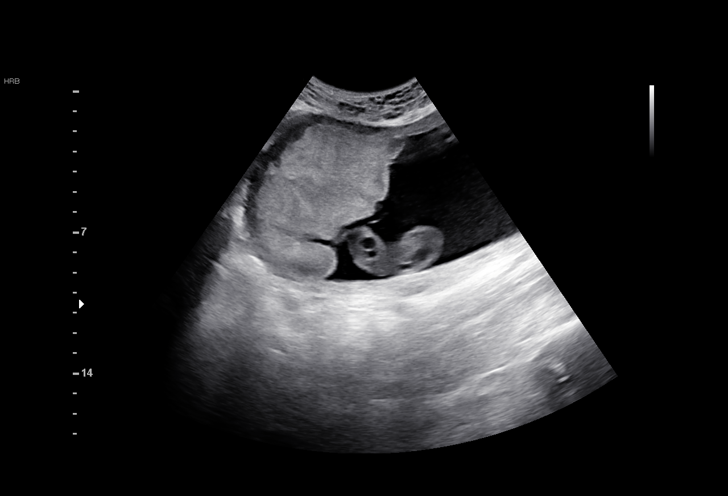

[14 of 28 positions shown; findings below may reference images not displayed]

----------------------------------------------------------------------

 ----------------------------------------------------------------------
Indications

  Maternal care for known or suspected poor
  fetal growth, second trimester, not applicable
  or unspecified IUGR
  Small for gestational age fetus affecting
  management of mother
  Advanced maternal age multigravida 35+,
  second trimester (low risk NIPS)
  Abnormal finding on antenatal screening
  (abnormal UAD)
  28 weeks gestation of pregnancy
 ----------------------------------------------------------------------
Vital Signs

 BMI:
Fetal Evaluation

 Num Of Fetuses:         1
 Fetal Heart Rate(bpm):  142
 Cardiac Activity:       Observed
 Presentation:           Breech
 Placenta:               Anterior
 P. Cord Insertion:      Previously Visualized

 Amniotic Fluid
 AFI FV:      Within normal limits

 AFI Sum(cm)     %Tile       Largest Pocket(cm)
 17.72           67
 RUQ(cm)       RLQ(cm)       LUQ(cm)        LLQ(cm)

OB History

 Gravidity:    3         Term:   0        Prem:   0        SAB:   0
 TOP:          1       Ectopic:  1        Living: 0
Gestational Age

 LMP:           28w 2d        Date:  06/15/18                 EDD:   03/22/19
 Best:          28w 2d     Det. By:  LMP  (06/15/18)          EDD:   03/22/19
Anatomy

 Thoracic:              Appears normal         Bladder:                Appears normal
 Stomach:               Appears normal, left
                        sided
Doppler - Fetal Vessels

 Umbilical Artery
  S/D     %tile                                            ADFV    RDFV
 10.2    > 97.5                                              Yes      No

Cervix Uterus Adnexa

 Cervix
 Not visualized (advanced GA >12wks)
Comments

  This patient was seen for an umbilical artery Doppler study
 due to an IUGR fetus.  She denies any problems since her
 last exam.  She reports feeling vigorous fetal movements
 throughout the day.

 Doppler studies of the umbilical arteries performed today due
 to IUGR showed intermittent absent end-diastolic flow.  The
 placenta appears to be very small.

 The patient was advised that placental dysfunction is most
 likely causing the IUGR and the abnormal umbilical artery
 Doppler studies.  The increased risk of early onset severe
 preeclampsia due to placental dysfunction was also
 discussed.

 We will continue to follow her closely with twice weekly fetal
 testing and umbilical artery Doppler studies.  The patient
 understands that our goal would be to try to get her to as
 close to 34 weeks as possible before delivery.  She has
 already received a complete course of antenatal
 corticosteroids.

## 2021-10-15 ENCOUNTER — Ambulatory Visit (HOSPITAL_COMMUNITY)
Admission: EM | Admit: 2021-10-15 | Discharge: 2021-10-15 | Disposition: A | Discharge status: Home / Self Care | Payer: Medicaid Other | Attending: Emergency Medicine | Admitting: Emergency Medicine

## 2021-10-15 ENCOUNTER — Encounter (HOSPITAL_COMMUNITY): Payer: Self-pay | Admitting: Emergency Medicine

## 2021-10-15 DIAGNOSIS — R519 Headache, unspecified: Secondary | ICD-10-CM | POA: Diagnosis not present

## 2021-10-15 MED ORDER — IBUPROFEN 800 MG PO TABS
ORAL_TABLET | ORAL | Status: AC
  Filled 2021-10-15: qty 1

## 2021-10-15 MED ORDER — IBUPROFEN 800 MG PO TABS
800.0000 mg | ORAL_TABLET | Freq: Once | ORAL | Status: AC
  Administered 2021-10-15: 800 mg via ORAL

## 2021-10-15 NOTE — Discharge Instructions (Addendum)
Try ibuprofen every 6 hours if headache persists.  Please keep an eye on your symptoms and go to the emergency department if anything worsens.
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
## 2021-10-24 ENCOUNTER — Telehealth (HOSPITAL_COMMUNITY): Payer: Self-pay | Admitting: Emergency Medicine

## 2021-10-24 NOTE — Telephone Encounter (Signed)
Patient called on 7/13 with concerns about her diagnosis on her discharge paperwork.  After reviewing with Lurena Joiner, APP, attempted to reach patient back, no answer, left voicemail

## 2021-10-27 NOTE — Telephone Encounter (Signed)
Patient returned call, states she is concerned for the confusion on diagnosis in dealing with insurance and wants to be sure everything is documented appropriately.  Awaiting provider review

## 2021-10-27 NOTE — Telephone Encounter (Signed)
Patient left voicemail over the weekend.  Attempted to return call today, LVM

## 2021-10-29 NOTE — Telephone Encounter (Signed)
Reviewed with Lurena Joiner, who states patient to follow up with any specific concerns insurance may have, but provider note should suffice.  Reviewed with patient and she verbalized understanding and states thank you for everything.

## 2022-01-22 ENCOUNTER — Encounter (HOSPITAL_COMMUNITY): Payer: Self-pay

## 2022-01-22 ENCOUNTER — Ambulatory Visit (HOSPITAL_COMMUNITY)
Admission: EM | Admit: 2022-01-22 | Discharge: 2022-01-22 | Disposition: A | Discharge status: Home / Self Care | Payer: Medicaid Other | Attending: Physician Assistant | Admitting: Physician Assistant

## 2022-01-22 DIAGNOSIS — R519 Headache, unspecified: Secondary | ICD-10-CM | POA: Diagnosis not present

## 2022-01-22 MED ORDER — KETOROLAC TROMETHAMINE 30 MG/ML IJ SOLN
30.0000 mg | Freq: Once | INTRAMUSCULAR | Status: AC
  Administered 2022-01-22: 30 mg via INTRAMUSCULAR

## 2022-01-22 MED ORDER — KETOROLAC TROMETHAMINE 30 MG/ML IJ SOLN
INTRAMUSCULAR | Status: AC
  Filled 2022-01-22: qty 1

## 2022-01-22 MED ORDER — ONDANSETRON 4 MG PO TBDP
ORAL_TABLET | ORAL | Status: AC
  Filled 2022-01-22: qty 1

## 2022-01-22 MED ORDER — ONDANSETRON 4 MG PO TBDP
4.0000 mg | ORAL_TABLET | Freq: Once | ORAL | Status: AC
  Administered 2022-01-22: 4 mg via ORAL

## 2022-01-22 NOTE — Discharge Instructions (Addendum)
Recommend Excedrin Migraine Drink plenty of fluids, rest Can use ice packs

## 2022-01-22 NOTE — ED Triage Notes (Signed)
Pt reports a headache since Wednesday. Pt reports taking OTC medication with no relief.
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
## 2022-03-03 ENCOUNTER — Ambulatory Visit (HOSPITAL_COMMUNITY)
Admission: RE | Admit: 2022-03-03 | Discharge: 2022-03-03 | Disposition: A | Discharge status: Home / Self Care | Payer: Medicaid Other | Source: Ambulatory Visit | Attending: Physician Assistant | Admitting: Physician Assistant

## 2022-03-03 ENCOUNTER — Encounter (HOSPITAL_COMMUNITY): Payer: Self-pay

## 2022-03-03 VITALS — BP 109/73 | HR 63 | Temp 98.4°F | Resp 16

## 2022-03-03 DIAGNOSIS — H0011 Chalazion right upper eyelid: Secondary | ICD-10-CM

## 2022-03-03 NOTE — ED Provider Notes (Signed)
[facility]    CSN: [id number] Arrival date & time: 03/03/22  1654      History   Chief Complaint Chief Complaint  Patient presents with   Eye Problem    HPI Linda Moyer is a 41 y.o. female.  Presents with bump over the right eyelid x5 days Yesterday it was really swollen but improved a lot today She has been applying hot compress, using lubricant eyedrops, eye patch at night  Denies vision changes.  No eye drainage  Past Medical History:  Diagnosis Date   Allergy    Chlamydia    Depression    Ectopic pregnancy 06/15/2016   Methotrexate   Gonorrhea    MRSA infection 2008   Polyhydramnios affecting pregnancy 11/09/2018   Guidelines for Antenatal Testing and Sonography  (with updated ICD-10 codes)  Updated  November 19, 2018 with Dr. Noralee Space  INDICATION U/S 2 X week NST/AFI  or full BPP wkly DELIVERY Polyhydramnios (moderate to severe) - O40.9XX0  (AFI>30, normal anatomy, no other comorbidities) **No testing for mild (AFI<30)** Q 4 wks 32 39          Trichomonas infection    Vaginal Pap smear, abnormal    cone bx, ok since    Patient Active Problem List   Diagnosis Date Noted   Preterm delivery (maternal condition) 02/04/2019   IUGR (intrauterine growth restriction) affecting care of mother 01/28/2019   Pregnancy affected by intrauterine growth retardation (IUGR) 01/27/2019   Pruritus 01/27/2019   AMA (advanced maternal age) multigravida 35+ 10/12/2018   Supervision of high risk pregnancy, antepartum 09/27/2018    Past Surgical History:  Procedure Laterality Date   CERVICAL CONE BIOPSY      OB History     Gravida  3   Para  1   Term      Preterm  1   AB  2   Living  1      SAB      IAB  1   Ectopic  1   Multiple  0   Live Births  1            Home Medications    Prior to Admission medications   Medication Sig Start Date End Date Taking? Authorizing Provider  aspirin EC 81 MG tablet Take 1 tablet (81 mg total) by mouth  daily. Take after 12 weeks for prevention of preeclampsia later in pregnancy 01/19/19   Constant, Peggy, MD  calcium carbonate (OS-CAL) 1250 (500 Ca) MG chewable tablet Chew 1 tablet by mouth daily.    [provider]  cholecalciferol (VITAMIN D3) 25 MCG (1000 UT) tablet Take 1,000 Units by mouth daily.    [provider]  ferrous sulfate 325 (65 FE) MG tablet Take 325 mg by mouth daily with breakfast.    [provider]  ibuprofen (ADVIL) 600 MG tablet Take 1 tablet (600 mg total) by mouth every 6 (six) hours. 02/04/19   Leftwich-Kirby, Wilmer Floor, CNM  Levonorgest-Eth Estrad-Fe Bisg (BALCOLTRA) 0.1-20 MG-MCG(21) TABS Take 1 tablet by mouth daily. 05/31/19   Constant, Peggy, MD  loratadine (CLARITIN) 10 MG tablet Take 10 mg by mouth daily as needed for itching.    [provider]  norethindrone (MICRONOR) 0.35 MG tablet Take 1 tablet (0.35 mg total) by mouth daily. 03/06/19   Constant, Peggy, MD  Norgestimate-Ethinyl Estradiol Triphasic 0.18/0.215/0.25 MG-25 MCG tab Take 1 tablet by mouth daily. 06/05/19   Constant, Gigi Gin, MD  Prenatal Vit w/Fe-Methylfol-FA (  PNV PO) Take by mouth.    [provider]    Family History Family History  Problem Relation Age of Onset   Asthma Mother    Hypertension Mother    Hypertension Father    Asthma Sister    Asthma Brother     Social History Social History   Tobacco Use   Smoking status: Former    Packs/day: 0.10    Types: Cigarettes    Quit date: 01/27/2018    Years since quitting: 4.0   Smokeless tobacco: Never  Vaping Use   Vaping Use: Never used  Substance Use Topics   Alcohol use: Not Currently    Comment: occasional    Drug use: No     Allergies   Pollen extract   Review of Systems Review of Systems   Physical Exam Triage Vital Signs ED Triage Vitals  Enc Vitals Group     BP 03/03/22 1721 109/73     Pulse Rate 03/03/22 1721 63     Resp 03/03/22 1721 16     Temp 03/03/22 1721 98.4 F  (36.9 C)     Temp Source 03/03/22 1721 Oral     SpO2 03/03/22 1721 97 %     Weight --      Height --      Head Circumference --      Peak Flow --      Pain Score 03/03/22 1722 0     Pain Loc --      Pain Edu? --      Excl. in GC? --    No data found.  Updated Vital Signs BP 109/73   Pulse 63   Temp 98.4 F (36.9 C) (Oral)   Resp 16   LMP 02/11/2022 (Exact Date)   SpO2 97%   Visual Acuity Right Eye Distance: 20/25 Left Eye Distance: 20/40 -1 Bilateral Distance: 20/25   Physical Exam Vitals and nursing note reviewed.  Constitutional:      General: She is not in acute distress. Eyes:     General: Vision grossly intact.     Extraocular Movements: Extraocular movements intact.     Conjunctiva/sclera: Conjunctivae normal.     Pupils: Pupils are equal, round, and reactive to light.      Comments: Chalazion of right eyelid  Cardiovascular:     Rate and Rhythm: Normal rate and regular rhythm.  Pulmonary:     Effort: Pulmonary effort is normal.  Neurological:     Mental Status: She is alert and oriented to person, place, and time.      UC Treatments / Results  Labs (all labs ordered are listed, but only abnormal results are displayed) Labs Reviewed - No data to display  EKG   Radiology No results found.  Procedures Procedures (including critical care time)  Medications Ordered in UC Medications - No data to display  Initial Impression / Assessment and Plan / UC Course  I have reviewed the triage vital signs and the nursing notes.  Pertinent labs & imaging results that were available during my care of the patient were reviewed by me and considered in my medical decision making (see chart for details).  Chalazion Reassuring that swelling has improved today.  Patient just wanted to be evaluated. Discussed use of warm compress, gentle massage, may need a few days to a week to fully resolve. Return precautions discussed. Patient agrees to plan  Final  Clinical Impressions(s) / UC Diagnoses   Final diagnoses:  Chalazion of right upper eyelid     Discharge Instructions      Continue warm compress, 4 times daily, about 10 minutes each time.  Gently massage the area afterwards.  Symptoms should improve over the next few days to a week    ED Prescriptions   None    PDMP not reviewed this encounter.   Brendon Christoffel, Ray Church 03/03/22 [id number]

## 2022-03-03 NOTE — Discharge Instructions (Addendum)
Continue warm compress, 4 times daily, about 10 minutes each time.  Gently massage the area afterwards.  Symptoms should improve over the next few days to a week

## 2022-03-03 NOTE — ED Triage Notes (Signed)
Pt started with possible stye to right eyelid 5 days ago. Has been using Stye lubricant eye drops. States swelling is better today, just wanted to follow through with appt. Also has used eye patch at night.

## 2022-04-09 ENCOUNTER — Telehealth: Payer: Self-pay | Admitting: Student

## 2022-04-09 ENCOUNTER — Encounter: Payer: Self-pay | Admitting: Student

## 2022-04-09 ENCOUNTER — Other Ambulatory Visit: Payer: Self-pay

## 2022-04-09 ENCOUNTER — Ambulatory Visit: Payer: Medicaid Other | Admitting: Student

## 2022-04-09 VITALS — BP 119/88 | HR 94 | Wt 138.0 lb

## 2022-04-09 DIAGNOSIS — N926 Irregular menstruation, unspecified: Secondary | ICD-10-CM | POA: Diagnosis not present

## 2022-04-09 DIAGNOSIS — K769 Liver disease, unspecified: Secondary | ICD-10-CM

## 2022-04-09 LAB — POCT URINE PREGNANCY: Preg Test, Ur: NEGATIVE
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
## 2022-04-09 NOTE — Telephone Encounter (Signed)
Patient walked in with document from [employer].  Copy placed in doctor's box

## 2022-04-09 NOTE — Assessment & Plan Note (Signed)
Incidental finding on image at [facility] urgent care.  Patient to bring in report of this. -CMP -CBC -Hep C

## 2022-04-09 NOTE — Assessment & Plan Note (Signed)
Discussed that 3 days in between periods does not qualify as irregular period yet. Discussed we can obtain urine pregnancy test today to rule this out.  If worsening irregularity/missing months or going weeks between periods can consider additional workup such as ultrasound.  No vaginal discharge today.

## 2022-04-09 NOTE — Patient Instructions (Signed)
It was great to see you! Thank you for allowing me to participate in your care!   I recommend that you always bring your medications to each appointment as this makes it easy to ensure we are on the correct medications and helps [place] not miss when refills are needed.  Our plans for today:  -We will check your labs today and follow-up with you on what these show -Please stop at the front desk to drop off the report that you were discussing about the liver lesion  We are checking some labs today, I will call you if they are abnormal will send you a MyChart message or a letter if they are normal.  If you do not hear about your labs in the next 2 weeks please let us know.  Take care and seek immediate care sooner if you develop any concerns. Please remember to show up 15 minutes before your scheduled appointment time!  Levin Erp, MD [facility]

## 2022-04-10 LAB — COMPREHENSIVE METABOLIC PANEL
ALT: 9 IU/L (ref 0–32)
AST: 14 IU/L (ref 0–40)
Albumin/Globulin Ratio: 1.4 (ref 1.2–2.2)
Albumin: 4.8 g/dL (ref 3.9–4.9)
Alkaline Phosphatase: 74 IU/L (ref 44–121)
BUN/Creatinine Ratio: 13 (ref 9–23)
BUN: 13 mg/dL (ref 6–20)
Bilirubin Total: 0.2 mg/dL (ref 0.0–1.2)
CO2: 22 mmol/L (ref 20–29)
Calcium: 10 mg/dL (ref 8.7–10.2)
Chloride: 101 mmol/L (ref 96–106)
Creatinine, Ser: 1.03 mg/dL — ABNORMAL HIGH (ref 0.57–1.00)
Globulin, Total: 3.5 g/dL (ref 1.5–4.5)
Glucose: 75 mg/dL (ref 70–99)
Potassium: 4.3 mmol/L (ref 3.5–5.2)
Sodium: 140 mmol/L (ref 134–144)
Total Protein: 8.3 g/dL (ref 6.0–8.5)
eGFR: 71 mL/min/{1.73_m2} (ref >59)

## 2022-04-10 LAB — CBC
Hematocrit: 40.3 % (ref 34.0–46.6)
Hemoglobin: 13.5 g/dL (ref 11.1–15.9)
MCH: 30.7 pg (ref 26.6–33.0)
MCHC: 33.5 g/dL (ref 31.5–35.7)
MCV: 92 fL (ref 79–97)
Platelets: 286 10*3/uL (ref 150–450)
RBC: 4.4 x10E6/uL (ref 3.77–5.28)
RDW: 11.7 % (ref 11.7–15.4)
WBC: 14.4 10*3/uL — ABNORMAL HIGH (ref 3.4–10.8)

## 2022-04-10 LAB — HCV AB W REFLEX TO QUANT PCR: HCV Ab: NONREACTIVE

## 2022-04-10 LAB — HCV INTERPRETATION

## 2022-04-20 NOTE — Telephone Encounter (Signed)
Patient calls nurse line requesting to speak with Dr. Jinny Sanders regarding next steps related to liver lesion.   Please return call to patient at [phone].  Talbot Grumbling, RN

## 2022-12-05 ENCOUNTER — Other Ambulatory Visit: Payer: Self-pay

## 2022-12-05 ENCOUNTER — Ambulatory Visit (HOSPITAL_COMMUNITY)
Admission: EM | Admit: 2022-12-05 | Discharge: 2022-12-05 | Disposition: A | Discharge status: Home / Self Care | Payer: Medicaid Other

## 2022-12-05 ENCOUNTER — Encounter (HOSPITAL_COMMUNITY): Payer: Self-pay | Admitting: Emergency Medicine

## 2022-12-05 DIAGNOSIS — R599 Enlarged lymph nodes, unspecified: Secondary | ICD-10-CM | POA: Diagnosis not present

## 2022-12-05 NOTE — Discharge Instructions (Signed)
Please contact your primary care provider for follow up if you have further concerns.

## 2022-12-05 NOTE — ED Triage Notes (Signed)
Pt requesting to be check for some lumps on her shoulder and neck that she started feeling them yesterday.

## 2022-12-05 NOTE — ED Provider Notes (Signed)
[facility]    CSN: [id number] Arrival date & time: 12/05/22  1354      History   Chief Complaint Chief Complaint  Patient presents with   Mass    HPI Linda Moyer is a 42 y.o. female.  Here with concerns of small bumps she felt on her neck last night They are not painful and not bothering her but she wanted them to be looked at. Has no history of this Maybe some seasonal or environmental allergies lately and her son is currently sick No fever, sore throat, abd pain, NVD, rash   Past Medical History:  Diagnosis Date   Allergy    Chlamydia    Depression    Ectopic pregnancy 06/15/2016   Methotrexate   Gonorrhea    MRSA infection 2008   Polyhydramnios affecting pregnancy 11/09/2018   Guidelines for Antenatal Testing and Sonography  (with updated ICD-10 codes)  Updated  11/24/2018 with Dr. Noralee Space  INDICATION U/S 2 X week NST/AFI  or full BPP wkly DELIVERY Polyhydramnios (moderate to severe) - O40.9XX0  (AFI>30, normal anatomy, no other comorbidities) **No testing for mild (AFI<30)** Q 4 wks 32 39          Trichomonas infection    Vaginal Pap smear, abnormal    cone bx, ok since    Patient Active Problem List   Diagnosis Date Noted   Lesion of liver 04/09/2022   Menstrual abnormality 04/09/2022    Past Surgical History:  Procedure Laterality Date   CERVICAL CONE BIOPSY      OB History     Gravida  3   Para  1   Term      Preterm  1   AB  2   Living  1      SAB      IAB  1   Ectopic  1   Multiple  0   Live Births  1            Home Medications    Prior to Admission medications   Medication Sig Start Date End Date Taking? Authorizing Provider  calcium carbonate (OS-CAL) 1250 (500 Ca) MG chewable tablet Chew 1 tablet by mouth daily.    [provider]  cholecalciferol (VITAMIN D3) 25 MCG (1000 UT) tablet Take 1,000 Units by mouth daily.    [provider]  ferrous sulfate 325 (65 FE) MG tablet Take 325  mg by mouth daily with breakfast.    [provider]  loratadine (CLARITIN) 10 MG tablet Take 10 mg by mouth daily as needed for itching.    [provider]    Family History Family History  Problem Relation Age of Onset   Asthma Mother    Hypertension Mother    Hypertension Father    Asthma Sister    Asthma Brother     Social History Social History   Tobacco Use   Smoking status: Former    Current packs/day: 0.00    Types: Cigarettes    Quit date: 01/27/2018    Years since quitting: 4.8   Smokeless tobacco: Never  Vaping Use   Vaping status: Never Used  Substance Use Topics   Alcohol use: Not Currently    Comment: occasional    Drug use: No     Allergies   Pollen extract   Review of Systems Review of Systems As per HPI  Physical Exam Triage Vital Signs ED Triage Vitals  Encounter Vitals Group  BP 12/05/22 1619 118/79     Systolic BP Percentile --      Diastolic BP Percentile --      Pulse Rate 12/05/22 1619 64     Resp 12/05/22 1619 18     Temp 12/05/22 1619 98 F (36.7 C)     Temp Source 12/05/22 1619 Oral     SpO2 12/05/22 1619 98 %     Weight 12/05/22 1620 138 lb 14.2 oz (63 kg)     Height 12/05/22 1620 5\' 6"  (1.676 m)     Head Circumference --      Peak Flow --      Pain Score 12/05/22 1620 3     Pain Loc --      Pain Education --      Exclude from Growth Chart --    No data found.  Updated Vital Signs BP 118/79 (BP Location: Right Arm)   Pulse 64   Temp 98 F (36.7 C) (Oral)   Resp 18   Ht 5\' 6"  (1.676 m)   Wt 138 lb 14.2 oz (63 kg)   SpO2 98%   BMI 22.42 kg/m     Physical Exam Vitals and nursing note reviewed.  Constitutional:      General: She is not in acute distress.    Appearance: Normal appearance.  HENT:     Mouth/Throat:     Mouth: Mucous membranes are moist.     Pharynx: Oropharynx is clear. No posterior oropharyngeal erythema.  Eyes:     Conjunctiva/sclera: Conjunctivae normal.  Neck:      Comments: A few small lymph nodes in the cervical chain are palpated on the right neck. All are mobile, rubbery, non tender Cardiovascular:     Rate and Rhythm: Normal rate and regular rhythm.     Heart sounds: Normal heart sounds.  Pulmonary:     Effort: Pulmonary effort is normal.     Breath sounds: Normal breath sounds.  Abdominal:     Tenderness: There is no abdominal tenderness.  Musculoskeletal:        General: Normal range of motion.     Cervical back: Normal range of motion. No tenderness.  Lymphadenopathy:     Cervical: Cervical adenopathy present.  Skin:    Findings: No rash.  Neurological:     Mental Status: She is alert and oriented to person, place, and time.     UC Treatments / Results  Labs (all labs ordered are listed, but only abnormal results are displayed) Labs Reviewed - No data to display  EKG  Radiology No results found.  Procedures Procedures   Medications Ordered in UC Medications - No data to display  Initial Impression / Assessment and Plan / UC Course  I have reviewed the triage vital signs and the nursing notes.  Pertinent labs & imaging results that were available during my care of the patient were reviewed by me and considered in my medical decision making (see chart for details).  Discussed lymphatic system and that I believe she is feeling some lymph nodes. Likely reactive, either to allergies or whatever virus her son has She has no fever, sore throat, or other infectious symptoms Advised to monitor. Reassurance provided.  Can return with acute concerns Follow with primary care for further testing if desired. Patient agreeable to plan   Final Clinical Impressions(s) / UC Diagnoses   Final diagnoses:  Reactive lymphadenopathy     Discharge Instructions      Please  contact your primary care provider for follow up if you have further concerns.     ED Prescriptions   None    PDMP not reviewed this encounter.   Marlow Baars, [place] 12/05/22 [id number]

## 2023-01-05 ENCOUNTER — Ambulatory Visit (HOSPITAL_COMMUNITY)
Admission: EM | Admit: 2023-01-05 | Discharge: 2023-01-05 | Disposition: A | Discharge status: Home / Self Care | Payer: Medicaid Other | Attending: Family Medicine | Admitting: Family Medicine

## 2023-01-05 ENCOUNTER — Encounter (HOSPITAL_COMMUNITY): Payer: Self-pay

## 2023-01-05 DIAGNOSIS — G43011 Migraine without aura, intractable, with status migrainosus: Secondary | ICD-10-CM | POA: Diagnosis not present

## 2023-01-05 MED ORDER — SUMATRIPTAN SUCCINATE 6 MG/0.5ML ~~LOC~~ SOLN
SUBCUTANEOUS | Status: AC
  Filled 2023-01-05: qty 0.5

## 2023-01-05 MED ORDER — KETOROLAC TROMETHAMINE 30 MG/ML IJ SOLN
INTRAMUSCULAR | Status: AC
  Filled 2023-01-05: qty 1

## 2023-01-05 MED ORDER — KETOROLAC TROMETHAMINE 30 MG/ML IJ SOLN
30.0000 mg | Freq: Once | INTRAMUSCULAR | Status: AC
  Administered 2023-01-05: 30 mg via INTRAMUSCULAR

## 2023-01-05 MED ORDER — KETOROLAC TROMETHAMINE 10 MG PO TABS: 10.0000 mg | ORAL_TABLET | Freq: Four times a day (QID) | ORAL | 0 refills | Status: DC | PRN

## 2023-01-05 MED ORDER — SUMATRIPTAN SUCCINATE 6 MG/0.5ML ~~LOC~~ SOLN
6.0000 mg | Freq: Once | SUBCUTANEOUS | Status: AC
  Administered 2023-01-05: 6 mg via SUBCUTANEOUS

## 2023-01-05 NOTE — Discharge Instructions (Signed)
You have been given a shot of Toradol(ketorolac) 30 mg and sumatriptan 6 mg today.  Ketorolac 10 mg tablets--take 1 tablet every 6 hours as needed for pain.  This is the same medicine that is in the shot we just gave you  Follow-up with your primary care about these headaches.  If the headache does not go away and especially if it worsens, please go to the emergency room for further evaluation

## 2023-01-05 NOTE — ED Provider Notes (Signed)
[facility]    CSN: [id number] Arrival date & time: 01/05/23  1943      History   Chief Complaint Chief Complaint  Patient presents with   Headache    HPI Linda Moyer is a 42 y.o. female.    Headache  Here for pain in her frontal area bilaterally that extends to her parietal and temporal areas.  It began on September 20, and has continued since.  She does have some photophobia.  No nausea now, but she had had some nausea a couple of weeks before this started. Last menstrual cycle started 2 days ago.  She has had similar headaches a time or 2 before, last time in August.  Past Medical History:  Diagnosis Date   Allergy    Chlamydia    Depression    Ectopic pregnancy 06/15/2016   Methotrexate   Gonorrhea    MRSA infection 2008   Polyhydramnios affecting pregnancy 11/09/2018   Guidelines for Antenatal Testing and Sonography  (with updated ICD-10 codes)  Updated  2018-11-28 with Dr. Noralee Space  INDICATION U/S 2 X week NST/AFI  or full BPP wkly DELIVERY Polyhydramnios (moderate to severe) - O40.9XX0  (AFI>30, normal anatomy, no other comorbidities) **No testing for mild (AFI<30)** Q 4 wks 32 39          Trichomonas infection    Vaginal Pap smear, abnormal    cone bx, ok since    Patient Active Problem List   Diagnosis Date Noted   Lesion of liver 04/09/2022   Menstrual abnormality 04/09/2022    Past Surgical History:  Procedure Laterality Date   CERVICAL CONE BIOPSY      OB History     Gravida  3   Para  1   Term      Preterm  1   AB  2   Living  1      SAB      IAB  1   Ectopic  1   Multiple  0   Live Births  1            Home Medications    Prior to Admission medications   Medication Sig Start Date End Date Taking? Authorizing Provider  ketorolac (TORADOL) 10 MG tablet Take 1 tablet (10 mg total) by mouth every 6 (six) hours as needed (pain). 01/05/23  Yes Zenia Resides, MD  calcium carbonate (OS-CAL) 1250  (500 Ca) MG chewable tablet Chew 1 tablet by mouth daily. Patient not taking: Reported on 01/05/2023    [provider]  cholecalciferol (VITAMIN D3) 25 MCG (1000 UT) tablet Take 1,000 Units by mouth daily. Patient not taking: Reported on 01/05/2023    [provider]  ferrous sulfate 325 (65 FE) MG tablet Take 325 mg by mouth daily with breakfast. Patient not taking: Reported on 01/05/2023    [provider]  loratadine (CLARITIN) 10 MG tablet Take 10 mg by mouth daily as needed for itching. Patient not taking: Reported on 01/05/2023    [provider]    Family History Family History  Problem Relation Age of Onset   Asthma Mother    Hypertension Mother    Hypertension Father    Asthma Sister    Asthma Brother     Social History Social History   Tobacco Use   Smoking status: Former    Current packs/day: 0.00    Types: Cigarettes    Quit date: 01/27/2018    Years  since quitting: 4.9   Smokeless tobacco: Never  Vaping Use   Vaping status: Never Used  Substance Use Topics   Alcohol use: Not Currently    Comment: occasional    Drug use: No     Allergies   Pollen extract   Review of Systems Review of Systems  Neurological:  Positive for headaches.     Physical Exam Triage Vital Signs ED Triage Vitals [01/05/23 1959]  Encounter Vitals Group     BP 91/65     Systolic BP Percentile      Diastolic BP Percentile      Pulse Rate 79     Resp 16     Temp 98.1 F (36.7 C)     Temp Source Oral     SpO2 97 %     Weight 138 lb (62.6 kg)     Height 5' 5.5" (1.664 m)     Head Circumference      Peak Flow      Pain Score 7     Pain Loc      Pain Education      Exclude from Growth Chart    No data found.  Updated Vital Signs BP 91/65 (BP Location: Right Arm)   Pulse 79   Temp 98.1 F (36.7 C) (Oral)   Resp 16   Ht 5' 5.5" (1.664 m)   Wt 62.6 kg   LMP 01/05/2023 (Exact Date)   SpO2 97%   BMI 22.62 kg/m   Visual  Acuity Right Eye Distance:   Left Eye Distance:   Bilateral Distance:    Right Eye Near:   Left Eye Near:    Bilateral Near:     Physical Exam Vitals reviewed.  Constitutional:      General: She is not in acute distress.    Appearance: She is not ill-appearing, toxic-appearing or diaphoretic.  HENT:     Mouth/Throat:     Mouth: Mucous membranes are moist.  Eyes:     Extraocular Movements: Extraocular movements intact.     Conjunctiva/sclera: Conjunctivae normal.     Pupils: Pupils are equal, round, and reactive to light.  Cardiovascular:     Rate and Rhythm: Normal rate and regular rhythm.     Heart sounds: No murmur heard. Pulmonary:     Effort: Pulmonary effort is normal.     Breath sounds: Normal breath sounds.  Musculoskeletal:     Cervical back: Neck supple.  Lymphadenopathy:     Cervical: No cervical adenopathy.  Neurological:     General: No focal deficit present.     Mental Status: She is alert and oriented to person, place, and time.     Cranial Nerves: No cranial nerve deficit.     Sensory: No sensory deficit.     Motor: No weakness.     Gait: Gait normal.     Deep Tendon Reflexes: Reflexes normal.  Psychiatric:        Behavior: Behavior normal.      UC Treatments / Results  Labs (all labs ordered are listed, but only abnormal results are displayed) Labs Reviewed - No data to display  EKG   Radiology No results found.  Procedures Procedures (including critical care time)  Medications Ordered in UC Medications  ketorolac (TORADOL) 30 MG/ML injection 30 mg (has no administration in time range)  SUMAtriptan (IMITREX) injection 6 mg (has no administration in time range)    Initial Impression / Assessment and Plan / UC Course  I have reviewed the triage vital signs and the nursing notes.  Pertinent labs & imaging results that were available during my care of the patient were reviewed by me and considered in my medical decision making (see chart  for details).     Injections of Toradol and Imitrex are given tonight.  A few more Toradol tablets are sent to the pharmacy.  I have asked her to follow-up with her primary care.  She is to go to the emergency room if she is not improving with injections given or if she worsens in any way Final Clinical Impressions(s) / UC Diagnoses   Final diagnoses:  Intractable migraine without aura and with status migrainosus     Discharge Instructions      You have been given a shot of Toradol(ketorolac) 30 mg and sumatriptan 6 mg today.  Ketorolac 10 mg tablets--take 1 tablet every 6 hours as needed for pain.  This is the same medicine that is in the shot we just gave you  Follow-up with your primary care about these headaches.  If the headache does not go away and especially if it worsens, please go to the emergency room for further evaluation    ED Prescriptions     Medication Sig Dispense Auth. Provider   ketorolac (TORADOL) 10 MG tablet Take 1 tablet (10 mg total) by mouth every 6 (six) hours as needed (pain). 20 tablet Tanijah Morais, Janace Aris, MD      PDMP not reviewed this encounter.   Zenia Resides, MD 01/05/23 2022

## 2023-01-05 NOTE — ED Triage Notes (Signed)
Patient here today with c/o headache since Friday. She has also had some nauseas since Thursday. She has tried taking Excedrin Migraine and Pepto Bismol with no relief.

## 2023-01-11 NOTE — Progress Notes (Unsigned)
    SUBJECTIVE:   CHIEF COMPLAINT / HPI: Migraine + Liver lesion  UC visit 9/24 for migraine--given toradol and imitrex and told to follow up at PCP  Incidental finding on MRI thoracic spine 12/07/2021 ordered by previous provider-R posterior hepatic lobe with round lesion 4 mm hyperinternse T2 may represent hepatic cyst   PERTINENT  PMH / PSH: ***  OBJECTIVE:   LMP 01/05/2023 (Exact Date)   ***  ASSESSMENT/PLAN:   No problem-specific Assessment & Plan notes found for this encounter.     Levin Erp, MD [facility]

## 2023-01-12 ENCOUNTER — Ambulatory Visit: Payer: Medicaid Other | Admitting: Student

## 2023-01-12 VITALS — BP 122/80 | HR 84 | Temp 98.0°F | Ht 65.5 in | Wt 136.2 lb

## 2023-01-12 DIAGNOSIS — G43809 Other migraine, not intractable, without status migrainosus: Secondary | ICD-10-CM | POA: Diagnosis present

## 2023-01-12 DIAGNOSIS — K769 Liver disease, unspecified: Secondary | ICD-10-CM

## 2023-01-12 DIAGNOSIS — G43909 Migraine, unspecified, not intractable, without status migrainosus: Secondary | ICD-10-CM | POA: Insufficient documentation

## 2023-01-12 MED ORDER — PROPRANOLOL HCL 40 MG PO TABS: 40.0000 mg | ORAL_TABLET | Freq: Every day | ORAL | 0 refills | Status: DC

## 2023-01-12 MED ORDER — NAPROXEN 500 MG PO TABS: 500.0000 mg | ORAL_TABLET | Freq: Two times a day (BID) | ORAL | 0 refills | Status: DC

## 2023-01-12 NOTE — Patient Instructions (Signed)
It was great to see you! Thank you for allowing me to participate in your care!   Our plans for today:  - I am going to send in naproxen to take instead of the ketorolac (take with meals and do not take with other NSAIDs) - I would want to start a prophylactic medicine called propanolol to help with migraines - I would like to see you back in 1 month to see if that decreases your headaches - I would like to get an ultrasound of your liver  Take care and seek immediate care sooner if you develop any concerns.  Levin Erp, MD

## 2023-01-12 NOTE — Assessment & Plan Note (Signed)
Migraines have been worsening over the past few months.  Does not appear to be a migraine given photophobia, phonophobia, nausea alongside the headaches.  Did have questionable reaction to Imitrex injection at urgent care.  Seem to have responded slightly to the NSAID that was prescribed. -Start naproxen 500 twice daily as needed for headaches -Stop Toradol tablets due to risk of GI bleed which was discussed with patient -Start propranolol 40 mg daily for migraine prophylaxis -1 month follow-up -ED/return precautions discussed

## 2023-01-12 NOTE — Assessment & Plan Note (Signed)
Most likely hepatic cyst.  Patient is asymptomatic and has had normal LFTs in the past.  Will obtain ultrasound for classification of this lesion. -Right upper quadrant ultrasound ordered

## 2023-01-15 ENCOUNTER — Ambulatory Visit: Payer: Medicaid Other | Admitting: Student

## 2023-01-20 ENCOUNTER — Ambulatory Visit (HOSPITAL_COMMUNITY)
Admission: RE | Admit: 2023-01-20 | Discharge: 2023-01-20 | Disposition: A | Discharge status: Home / Self Care | Payer: Medicaid Other | Source: Ambulatory Visit | Attending: Family Medicine | Admitting: Family Medicine

## 2023-01-20 DIAGNOSIS — K769 Liver disease, unspecified: Secondary | ICD-10-CM | POA: Diagnosis present

## 2023-01-21 ENCOUNTER — Telehealth: Payer: Self-pay | Admitting: Student

## 2023-01-21 NOTE — Telephone Encounter (Signed)
Called patient x 2 with no response.  Voicemail left on machine to return call or check MyChart for results.  If patient calls back: Her liver ultrasound showed 2 benign (noncancerous) masses that appear to be hemangiomas.  There is not much that we need to do unless she becomes symptomatic.  We will get a follow-up liver ultrasound in 6 months to make sure that this size is not increasing rapidly.  The main risks with these are if she were to become pregnant that these can grow and bleeding from these are possible but still very rare.  There also are some fatty changes to her liver which mostly come from diet.  Recommend staying active and eating as much fruits and vegetables as possible.  If any questions please let us know.

## 2023-03-24 DIAGNOSIS — Z113 Encounter for screening for infections with a predominantly sexual mode of transmission: Secondary | ICD-10-CM | POA: Diagnosis not present

## 2023-03-24 DIAGNOSIS — Z30011 Encounter for initial prescription of contraceptive pills: Secondary | ICD-10-CM | POA: Diagnosis not present

## 2023-03-24 DIAGNOSIS — Z114 Encounter for screening for human immunodeficiency virus [HIV]: Secondary | ICD-10-CM | POA: Diagnosis not present

## 2023-08-03 ENCOUNTER — Ambulatory Visit: Admitting: Student

## 2023-08-03 ENCOUNTER — Telehealth: Payer: Self-pay | Admitting: Student

## 2023-08-03 VITALS — BP 115/82 | HR 99 | Ht 65.5 in | Wt 139.2 lb

## 2023-08-03 DIAGNOSIS — G43809 Other migraine, not intractable, without status migrainosus: Secondary | ICD-10-CM

## 2023-08-03 DIAGNOSIS — K769 Liver disease, unspecified: Secondary | ICD-10-CM

## 2023-08-03 MED ORDER — NAPROXEN 500 MG PO TABS: 500.0000 mg | ORAL_TABLET | Freq: Two times a day (BID) | ORAL | 0 refills | Status: AC

## 2023-08-03 MED ORDER — PROCHLORPERAZINE MALEATE 10 MG PO TABS: 10.0000 mg | ORAL_TABLET | Freq: Four times a day (QID) | ORAL | 0 refills | Status: AC | PRN

## 2023-08-03 MED ORDER — PROPRANOLOL HCL 40 MG PO TABS: 40.0000 mg | ORAL_TABLET | Freq: Every day | ORAL | 0 refills | Status: DC

## 2023-08-03 MED ORDER — DIPHENHYDRAMINE HCL 25 MG PO CAPS: 25.0000 mg | ORAL_CAPSULE | Freq: Four times a day (QID) | ORAL | 0 refills | Status: DC | PRN

## 2023-08-03 NOTE — Assessment & Plan Note (Signed)
 Liver hemangiomas with no current symptoms. Needs q6 month U/S - Order liver ultrasound

## 2023-08-03 NOTE — Assessment & Plan Note (Signed)
 Chronic migraines, unresponsive to previous treatments. No preventive medication used. Family history present. Normal neurological exam. Propranolol  recommended for prevention. Low likelihood of pregnancy - Prescribe naproxen  500 mg twice daily for five days with food, then as needed. - Prescribe Compazine  10 mg every six hours as needed. - Prescribe Benadryl  every six hours as needed. - Start propranolol  for prevention post-episode, once daily. - Advise tracking headache frequency and characteristics. - Schedule follow-up in one month to assess treatment efficacy and consider headache clinic referral if needed. - Advise work absence through August 04, 2023, provide note if necessary. - Instruct to call if symptoms persist despite medication.

## 2023-08-03 NOTE — Telephone Encounter (Signed)
 Patient calls stating she had a missed call from [place]. I do not see any notes where Linda Moyer called her but told patient I would send back a message. Please advise.
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
## 2023-08-03 NOTE — Patient Instructions (Addendum)
 It was great to see you today! Thank you for choosing [facility] for your primary care.  Today we addressed: Take Naprosyn  500 mg twice a day for a total of 5 days then space to as needed Compazine  10 mg every 6 hours as needed for migraines for next couple of days  Benadryl  as needed every 6 hours for migraines  After the initial headache subside, start propranolol  daily to prevent migraines We will call to schedule the ultrasound of the liver, please give us  a call if you have not heard from us  in 2 weeks  Return for migraines to see pcp in 1-2 months   If you haven't already, sign up for My Chart to have easy access to your labs results, and communication with your primary care physician.   Please arrive 15 minutes before your appointment to ensure smooth check in process.  We appreciate your efforts in making this happen.  Thank you for allowing me to participate in your care, Ernestina Headland, MD 08/03/2023, 11:14 AM PGY-3, [facility]

## 2023-08-05 ENCOUNTER — Telehealth: Payer: Self-pay

## 2023-08-05 NOTE — Telephone Encounter (Signed)
 Scheduled patient US  for 04/25 @800  am. Patient has been notified.

## 2023-08-05 NOTE — Telephone Encounter (Signed)
-----   Message from [facility] Genevia Kern S sent at 08/05/2023 10:27 AM EDT ----- Ok to schedule US  at [facility].  Thanks! Linda Moyer

## 2023-08-06 ENCOUNTER — Other Ambulatory Visit (HOSPITAL_COMMUNITY)

## 2023-08-06 ENCOUNTER — Ambulatory Visit (HOSPITAL_COMMUNITY)
Admission: RE | Admit: 2023-08-06 | Discharge: 2023-08-06 | Disposition: A | Discharge status: Home / Self Care | Source: Ambulatory Visit | Attending: Family Medicine | Admitting: Family Medicine

## 2023-08-06 DIAGNOSIS — K769 Liver disease, unspecified: Secondary | ICD-10-CM | POA: Insufficient documentation

## 2023-08-07 ENCOUNTER — Encounter: Payer: Self-pay | Admitting: Student

## 2023-08-12 ENCOUNTER — Encounter: Payer: Self-pay | Admitting: Student

## 2023-08-29 NOTE — Progress Notes (Signed)
    SUBJECTIVE:   CHIEF COMPLAINT / HPI:   Discussed the use of AI scribe software for clinical note transcription with the patient, who gave verbal consent to proceed.  Started on propanolol ppx 4/22, compazine  benadryl , naproxen  History of Present Illness Linda Moyer is a 43 year old female who presents with migraines.  She experiences migraines a few times a month, worsened by light and sound. She has been prescribed propranolol  but has not been taking it consistently due to forgetfulness and an aversion to daily medication.  She only took it 1 time by accident.  She took naproxen  twice daily for five days last week, which provided some relief, but she forgot to continue the regimen. She keeps some propranolol  in her wallet but still forgets to take it.  Her blood pressure tends to be low, but she has not experienced dizziness or lightheadedness. She has not been drinking much water recently, opting for juice instead, due to not replenishing her water supply.  PERTINENT  PMH / PSH: Migraine, reviewed  OBJECTIVE:   BP 91/66   Pulse 84   Ht 5' 5.5" (1.664 m)   Wt 140 lb 9.6 oz (63.8 kg)   LMP 08/12/2023   SpO2 100%   BMI 23.04 kg/m   General: Well appearing, NAD, awake, alert, responsive to questions Head: Normocephalic atraumatic CV: Regular rate and rhythm no murmurs rubs or gallops Respiratory: Clear to ausculation bilaterally, no wheezes rales or crackles, chest rises symmetrically,  no increased work of breathing Neuro: CN II: PERRL CN III, IV,VI: EOMI CV V: Normal sensation in V1, V2, V3 CVII: Symmetric smile and brow raise CN VIII: Normal hearing CN IX,X: Symmetric palate raise  CN XI: 5/5 shoulder shrug CN XII: Symmetric tongue protrusion  UE and LE strength 5/5 Normal sensation in UE and LE bilaterally  No ataxia with finger to nose  ASSESSMENT/PLAN:   Assessment & Plan Intractable chronic migraine without aura and without status migrainosus Migraines  occur a few times monthly, manageable with as-needed medication. Neurologic exam normal, low likelihood of structural brain issues. She expressed interest in neurology referral. - Refer to neurology for further evaluation and potential injection therapy for control - Discontinue propranolol  given low blood pressure and patient not taking medication -If interested in daily oral controller medication in future would trial Depakote Hypotension, unspecified hypotension type Blood pressure low today, possibly influenced by inadequate hydration. No dizziness or lightheadedness reported. - Encourage adequate hydration - Discontinue propanolol (not taking)   Linda Kidd, MD [facility]

## 2023-08-30 ENCOUNTER — Encounter: Payer: Self-pay | Admitting: Student

## 2023-08-30 ENCOUNTER — Ambulatory Visit (INDEPENDENT_AMBULATORY_CARE_PROVIDER_SITE_OTHER): Admitting: Student

## 2023-08-30 VITALS — BP 91/66 | HR 84 | Ht 65.5 in | Wt 140.6 lb

## 2023-08-30 DIAGNOSIS — I959 Hypotension, unspecified: Secondary | ICD-10-CM | POA: Diagnosis not present

## 2023-08-30 DIAGNOSIS — G43719 Chronic migraine without aura, intractable, without status migrainosus: Secondary | ICD-10-CM

## 2023-08-30 NOTE — Assessment & Plan Note (Signed)
 Migraines occur a few times monthly, manageable with as-needed medication. Neurologic exam normal, low likelihood of structural brain issues. She expressed interest in neurology referral. - Refer to neurology for further evaluation and potential injection therapy for control - Discontinue propranolol  given low blood pressure and patient not taking medication -If interested in daily oral controller medication in future would trial Depakote

## 2023-08-30 NOTE — Patient Instructions (Addendum)
 It was great to see you! Thank you for allowing me to participate in your care!   Our plans for today:  - I am referring you to neurology to discuss some injection options for migraines if no response in 3-4 weeks, please call our office for update - Please stop the propranolol  as your blood pressure is quite low, let us  know if you are interested in a daily controller medicine because we do have other options  Take care and seek immediate care sooner if you develop any concerns.  [name], MD

## 2023-09-14 ENCOUNTER — Other Ambulatory Visit: Payer: Self-pay | Admitting: Student

## 2023-09-14 DIAGNOSIS — G43809 Other migraine, not intractable, without status migrainosus: Secondary | ICD-10-CM

## 2023-09-21 ENCOUNTER — Encounter: Payer: Self-pay | Admitting: *Deleted
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
## 2023-12-15 ENCOUNTER — Encounter: Payer: Self-pay | Admitting: Neurology

## 2023-12-15 ENCOUNTER — Ambulatory Visit (INDEPENDENT_AMBULATORY_CARE_PROVIDER_SITE_OTHER): Admitting: Neurology

## 2023-12-15 VITALS — BP 99/51 | HR 88 | Ht 65.0 in | Wt 140.0 lb

## 2023-12-15 DIAGNOSIS — G43009 Migraine without aura, not intractable, without status migrainosus: Secondary | ICD-10-CM

## 2023-12-15 MED ORDER — SUMATRIPTAN SUCCINATE 50 MG PO TABS: 50.0000 mg | ORAL_TABLET | ORAL | 5 refills | Status: AC | PRN

## 2023-12-15 MED ORDER — AMITRIPTYLINE HCL 10 MG PO TABS: 10.0000 mg | ORAL_TABLET | Freq: Every day | ORAL | 5 refills | Status: AC

## 2023-12-15 NOTE — Patient Instructions (Signed)
  Start AMITRIPTYLINE  10MG  AT BEDTIME.  Contact us  in 4 weeks with update and we can increase dose if needed. Take SUMATRIPTAN  50MG  at earliest onset of headache.  May repeat dose once in 2 hours if needed.  Maximum 2 tablets in 24 hours. Limit use of pain relievers to no more than 9 days out of the month.  These medications include acetaminophen , NSAIDs (ibuprofen /Advil /Motrin , naproxen /Aleve , triptans (Imitrex /sumatriptan ), Excedrin, and narcotics.  This will help reduce risk of rebound headaches. Be aware of common food triggers:  - Caffeine:  coffee, black tea, cola, Mt. Dew  - Chocolate  - Dairy:  aged cheeses (brie, blue, cheddar, gouda, Parmasan, provolone, romano, Swiss, etc), chocolate milk, buttermilk, sour cream, limit eggs and yogurt  - Nuts, peanut butter  - Alcohol  - Cereals/grains:  FRESH breads (fresh bagels, sourdough, doughnuts), yeast productions  - Processed/canned/aged/cured meats (pre-packaged deli meats, hotdogs)  - MSG/glutamate:  soy sauce, flavor enhancer, pickled/preserved/marinated foods  - Sweeteners:  aspartame (Equal, Nutrasweet).  Sugar and Splenda are okay  - Vegetables:  legumes (lima beans, lentils, snow peas, fava beans, pinto peans, peas, garbanzo beans), sauerkraut, onions, olives, pickles  - Fruit:  avocados, bananas, citrus fruit (orange, lemon, grapefruit), mango  - Other:  Frozen meals, macaroni and cheese Routine exercise Stay adequately hydrated (aim for 64 oz water daily) Keep headache diary Maintain proper stress management Maintain proper sleep hygiene Do not skip meals Consider supplements:  magnesium citrate 400mg  daily, riboflavin 400mg  daily, coenzyme Q10 100mg  three times daily.

## 2024-01-17 ENCOUNTER — Ambulatory Visit: Admitting: Neurology

## 2024-02-01 ENCOUNTER — Ambulatory Visit (INDEPENDENT_AMBULATORY_CARE_PROVIDER_SITE_OTHER): Admitting: Family Medicine

## 2024-02-01 VITALS — BP 94/66 | HR 73 | Ht 65.5 in | Wt 141.0 lb

## 2024-02-01 DIAGNOSIS — N926 Irregular menstruation, unspecified: Secondary | ICD-10-CM | POA: Diagnosis present

## 2024-02-01 DIAGNOSIS — R42 Dizziness and giddiness: Secondary | ICD-10-CM | POA: Diagnosis not present

## 2024-02-01 LAB — POCT URINE PREGNANCY: Preg Test, Ur: NEGATIVE

## 2024-02-01 NOTE — Progress Notes (Signed)
    SUBJECTIVE:   CHIEF COMPLAINT / HPI:   Missed period FDLMP: 9/15 Periods typically monthly, 7 days in length. She notes last month her period was 2 weeks late. Negative pregnancy test at home. She does feel bloated sometimes. No urinary symptoms, no changes to bowel habits. Currently not using contraception. Currently sexually active - most recently 10/15. She thinks she is starting menopause, notes family history of early menopause.  PERTINENT  PMH / PSH: Reviewed.  OBJECTIVE:   BP 94/66   Pulse 73   Ht 5' 5.5 (1.664 m)   Wt 141 lb (64 kg)   LMP 12/27/2023   SpO2 99%   BMI 23.11 kg/m   General: well-appearing, no acute distress. HEENT: normocephalic, PERRLA, EOM grossly intact, MMM Cardio: Regular rate, regular rhythm, no murmurs on exam. Pulm: Clear, no wheezing, no crackles. No increased work of breathing. Abdominal: bowel sounds present, soft, non-tender, non-distended. No HSM. Extremities: no peripheral edema. Moves all extremities equally. Neuro: Alert and oriented x3, speech normal in content, no facial asymmetry Psych:  Cognition and judgment appear intact.    ASSESSMENT/PLAN:   Assessment & Plan Missed period Possible that she is entering early menopause, particularly given family history. Urine pregnancy today negative. Counseled patient on perimenopausal symptoms. She is up to date on pap. - TSH, prolactin, CMP, CBC today - if bothersome symptoms occur (hot flashes, etc) pt will return to discuss symptom management    Lauraine Norse, DO [facility]

## 2024-02-01 NOTE — Patient Instructions (Addendum)
 Your pregnancy test today is pending - we will let you know the results. We will get some other labs today and I will let you know the results when they are available.

## 2024-02-02 ENCOUNTER — Ambulatory Visit: Payer: Self-pay | Admitting: Family Medicine

## 2024-02-02 LAB — CBC WITH DIFFERENTIAL/PLATELET
Basophils Absolute: 0.1 x10E3/uL (ref 0.0–0.2)
Basos: 1 %
EOS (ABSOLUTE): 0.3 x10E3/uL (ref 0.0–0.4)
Eos: 4 %
Hematocrit: 39.8 % (ref 34.0–46.6)
Hemoglobin: 12.8 g/dL (ref 11.1–15.9)
Immature Grans (Abs): 0 x10E3/uL (ref 0.0–0.1)
Immature Granulocytes: 0 %
Lymphocytes Absolute: 2.9 x10E3/uL (ref 0.7–3.1)
Lymphs: 35 %
MCH: 30.2 pg (ref 26.6–33.0)
MCHC: 32.2 g/dL (ref 31.5–35.7)
MCV: 94 fL (ref 79–97)
Monocytes Absolute: 0.6 x10E3/uL (ref 0.1–0.9)
Monocytes: 7 %
Neutrophils Absolute: 4.5 x10E3/uL (ref 1.4–7.0)
Neutrophils: 53 %
Platelets: 274 x10E3/uL (ref 150–450)
RBC: 4.24 x10E6/uL (ref 3.77–5.28)
RDW: 11.8 % (ref 11.7–15.4)
WBC: 8.4 x10E3/uL (ref 3.4–10.8)

## 2024-02-02 LAB — COMPREHENSIVE METABOLIC PANEL WITH GFR
ALT: 7 IU/L (ref 0–32)
AST: 12 IU/L (ref 0–40)
Albumin: 4.3 g/dL (ref 3.9–4.9)
Alkaline Phosphatase: 73 IU/L (ref 41–116)
BUN/Creatinine Ratio: 14 (ref 9–23)
BUN: 11 mg/dL (ref 6–24)
Bilirubin Total: 0.4 mg/dL (ref 0.0–1.2)
CO2: 24 mmol/L (ref 20–29)
Calcium: 9.3 mg/dL (ref 8.7–10.2)
Chloride: 102 mmol/L (ref 96–106)
Creatinine, Ser: 0.81 mg/dL (ref 0.57–1.00)
Globulin, Total: 3.1 g/dL (ref 1.5–4.5)
Glucose: 104 mg/dL — ABNORMAL HIGH (ref 70–99)
Potassium: 4.2 mmol/L (ref 3.5–5.2)
Sodium: 138 mmol/L (ref 134–144)
Total Protein: 7.4 g/dL (ref 6.0–8.5)
eGFR: 93 mL/min/1.73 (ref >59)

## 2024-02-02 LAB — TSH: TSH: 0.604 u[IU]/mL (ref 0.450–4.500)

## 2024-02-02 LAB — PROLACTIN: Prolactin: 21.5 ng/mL (ref 4.8–33.4)

## 2024-08-14 ENCOUNTER — Ambulatory Visit: Admitting: Neurology
